# Patient Record
Sex: Female | Born: 1960 | Race: White | Hispanic: No | Marital: Married | Smoking: Former smoker
Health system: Southern US, Community
[De-identification: ages and names within clinical notes are randomized; demographics above are authoritative.]

## PROBLEM LIST (undated history)

## (undated) DIAGNOSIS — G4730 Sleep apnea, unspecified: Secondary | ICD-10-CM

## (undated) DIAGNOSIS — F32A Depression, unspecified: Secondary | ICD-10-CM

## (undated) DIAGNOSIS — F329 Major depressive disorder, single episode, unspecified: Secondary | ICD-10-CM

## (undated) DIAGNOSIS — M1990 Unspecified osteoarthritis, unspecified site: Secondary | ICD-10-CM

## (undated) DIAGNOSIS — Z780 Asymptomatic menopausal state: Secondary | ICD-10-CM

## (undated) DIAGNOSIS — K219 Gastro-esophageal reflux disease without esophagitis: Secondary | ICD-10-CM

## (undated) DIAGNOSIS — F419 Anxiety disorder, unspecified: Secondary | ICD-10-CM

## (undated) DIAGNOSIS — R310 Gross hematuria: Secondary | ICD-10-CM

## (undated) DIAGNOSIS — J189 Pneumonia, unspecified organism: Secondary | ICD-10-CM

## (undated) DIAGNOSIS — M797 Fibromyalgia: Secondary | ICD-10-CM

## (undated) DIAGNOSIS — E785 Hyperlipidemia, unspecified: Secondary | ICD-10-CM

## (undated) DIAGNOSIS — R7303 Prediabetes: Secondary | ICD-10-CM

## (undated) DIAGNOSIS — Z87442 Personal history of urinary calculi: Secondary | ICD-10-CM

## (undated) DIAGNOSIS — O021 Missed abortion: Secondary | ICD-10-CM

## (undated) DIAGNOSIS — R300 Dysuria: Secondary | ICD-10-CM

## (undated) DIAGNOSIS — K2970 Gastritis, unspecified, without bleeding: Secondary | ICD-10-CM

## (undated) DIAGNOSIS — J302 Other seasonal allergic rhinitis: Secondary | ICD-10-CM

## (undated) DIAGNOSIS — K275 Chronic or unspecified peptic ulcer, site unspecified, with perforation: Secondary | ICD-10-CM

## (undated) DIAGNOSIS — I499 Cardiac arrhythmia, unspecified: Secondary | ICD-10-CM

## (undated) DIAGNOSIS — K2210 Ulcer of esophagus without bleeding: Secondary | ICD-10-CM

## (undated) HISTORY — PX: FOOT SURGERY: SHX648

## (undated) HISTORY — DX: Other seasonal allergic rhinitis: J30.2

## (undated) HISTORY — DX: Major depressive disorder, single episode, unspecified: F32.9

## (undated) HISTORY — DX: Gastritis, unspecified, without bleeding: K29.70

## (undated) HISTORY — DX: Depression, unspecified: F32.A

## (undated) HISTORY — PX: COLONOSCOPY: SHX174

## (undated) HISTORY — DX: Missed abortion: O02.1

## (undated) HISTORY — DX: Sleep apnea, unspecified: G47.30

## (undated) HISTORY — DX: Hyperlipidemia, unspecified: E78.5

## (undated) HISTORY — DX: Anxiety disorder, unspecified: F41.9

## (undated) HISTORY — PX: KNEE ARTHROSCOPY: SUR90

## (undated) HISTORY — DX: Fibromyalgia: M79.7

## (undated) HISTORY — DX: Chronic or unspecified peptic ulcer, site unspecified, with perforation: K27.5

## (undated) HISTORY — DX: Unspecified osteoarthritis, unspecified site: M19.90

## (undated) HISTORY — PX: DILATION AND CURETTAGE OF UTERUS: SHX78

## (undated) HISTORY — DX: Asymptomatic menopausal state: Z78.0

## (undated) HISTORY — DX: Ulcer of esophagus without bleeding: K22.10

---

## 1995-03-17 HISTORY — PX: VAGINAL HYSTERECTOMY: SUR661

## 1997-07-17 ENCOUNTER — Other Ambulatory Visit: Admission: RE | Admit: 1997-07-17 | Discharge: 1997-07-17 | Payer: Self-pay | Admitting: Gynecology

## 1997-10-26 ENCOUNTER — Other Ambulatory Visit: Admission: RE | Admit: 1997-10-26 | Discharge: 1997-10-26 | Payer: Self-pay | Admitting: Gynecology

## 1997-12-25 ENCOUNTER — Inpatient Hospital Stay (HOSPITAL_COMMUNITY): Admission: AD | Admit: 1997-12-25 | Discharge: 1997-12-26 | Payer: Self-pay | Admitting: Gynecology

## 1997-12-29 ENCOUNTER — Inpatient Hospital Stay (HOSPITAL_COMMUNITY): Admission: AD | Admit: 1997-12-29 | Discharge: 1997-12-29 | Payer: Self-pay | Admitting: Obstetrics and Gynecology

## 1998-01-07 ENCOUNTER — Ambulatory Visit (HOSPITAL_COMMUNITY): Admission: RE | Admit: 1998-01-07 | Discharge: 1998-01-07 | Payer: Self-pay | Admitting: Gynecology

## 1999-02-11 ENCOUNTER — Other Ambulatory Visit: Admission: RE | Admit: 1999-02-11 | Discharge: 1999-02-11 | Payer: Self-pay | Admitting: Gynecology

## 2000-02-11 ENCOUNTER — Other Ambulatory Visit: Admission: RE | Admit: 2000-02-11 | Discharge: 2000-02-11 | Payer: Self-pay | Admitting: Gynecology

## 2000-03-20 ENCOUNTER — Emergency Department (HOSPITAL_COMMUNITY): Admission: EM | Admit: 2000-03-20 | Discharge: 2000-03-20 | Payer: Self-pay | Admitting: Emergency Medicine

## 2001-02-14 ENCOUNTER — Other Ambulatory Visit: Admission: RE | Admit: 2001-02-14 | Discharge: 2001-02-14 | Payer: Self-pay | Admitting: Gynecology

## 2002-02-15 ENCOUNTER — Other Ambulatory Visit: Admission: RE | Admit: 2002-02-15 | Discharge: 2002-02-15 | Payer: Self-pay | Admitting: Gynecology

## 2003-02-27 ENCOUNTER — Other Ambulatory Visit: Admission: RE | Admit: 2003-02-27 | Discharge: 2003-02-27 | Payer: Self-pay | Admitting: Gynecology

## 2003-06-06 ENCOUNTER — Ambulatory Visit (HOSPITAL_BASED_OUTPATIENT_CLINIC_OR_DEPARTMENT_OTHER): Admission: RE | Admit: 2003-06-06 | Discharge: 2003-06-06 | Payer: Self-pay | Admitting: Otolaryngology

## 2003-06-26 ENCOUNTER — Encounter: Admission: RE | Admit: 2003-06-26 | Discharge: 2003-06-26 | Payer: Self-pay | Admitting: Family Medicine

## 2004-02-28 ENCOUNTER — Other Ambulatory Visit: Admission: RE | Admit: 2004-02-28 | Discharge: 2004-02-28 | Payer: Self-pay | Admitting: Gynecology

## 2005-03-05 ENCOUNTER — Other Ambulatory Visit: Admission: RE | Admit: 2005-03-05 | Discharge: 2005-03-05 | Payer: Self-pay | Admitting: Gynecology

## 2005-03-05 ENCOUNTER — Ambulatory Visit (HOSPITAL_BASED_OUTPATIENT_CLINIC_OR_DEPARTMENT_OTHER): Admission: RE | Admit: 2005-03-05 | Discharge: 2005-03-05 | Payer: Self-pay | Admitting: Otolaryngology

## 2005-03-09 ENCOUNTER — Ambulatory Visit: Payer: Self-pay | Admitting: Internal Medicine

## 2006-03-10 ENCOUNTER — Other Ambulatory Visit: Admission: RE | Admit: 2006-03-10 | Discharge: 2006-03-10 | Payer: Self-pay | Admitting: Gynecology

## 2006-03-29 ENCOUNTER — Encounter: Admission: RE | Admit: 2006-03-29 | Discharge: 2006-06-27 | Payer: Self-pay | Admitting: Gynecology

## 2007-03-15 ENCOUNTER — Other Ambulatory Visit: Admission: RE | Admit: 2007-03-15 | Discharge: 2007-03-15 | Payer: Self-pay | Admitting: Gynecology

## 2007-08-15 ENCOUNTER — Emergency Department (HOSPITAL_COMMUNITY): Admission: EM | Admit: 2007-08-15 | Discharge: 2007-08-15 | Payer: Self-pay | Admitting: Emergency Medicine

## 2007-12-26 ENCOUNTER — Encounter: Admission: RE | Admit: 2007-12-26 | Discharge: 2007-12-26 | Payer: Self-pay | Admitting: Internal Medicine

## 2008-02-16 ENCOUNTER — Encounter: Admission: RE | Admit: 2008-02-16 | Discharge: 2008-02-16 | Payer: Self-pay | Admitting: Cardiology

## 2008-03-15 ENCOUNTER — Other Ambulatory Visit: Admission: RE | Admit: 2008-03-15 | Discharge: 2008-03-15 | Payer: Self-pay | Admitting: Gynecology

## 2008-03-15 ENCOUNTER — Encounter: Payer: Self-pay | Admitting: Women's Health

## 2008-03-15 ENCOUNTER — Ambulatory Visit: Payer: Self-pay | Admitting: Women's Health

## 2009-01-14 ENCOUNTER — Emergency Department (HOSPITAL_COMMUNITY): Admission: EM | Admit: 2009-01-14 | Discharge: 2009-01-14 | Payer: Self-pay | Admitting: Emergency Medicine

## 2009-01-16 ENCOUNTER — Ambulatory Visit: Payer: Self-pay | Admitting: Gynecology

## 2009-03-16 HISTORY — PX: FOOT SURGERY: SHX648

## 2009-03-27 ENCOUNTER — Ambulatory Visit: Payer: Self-pay | Admitting: Gynecology

## 2009-03-27 ENCOUNTER — Other Ambulatory Visit: Admission: RE | Admit: 2009-03-27 | Discharge: 2009-03-27 | Payer: Self-pay | Admitting: Gynecology

## 2009-06-12 ENCOUNTER — Ambulatory Visit: Payer: Self-pay | Admitting: Gynecology

## 2010-04-08 ENCOUNTER — Encounter
Admission: RE | Admit: 2010-04-08 | Discharge: 2010-04-08 | Payer: Self-pay | Source: Home / Self Care | Attending: Internal Medicine | Admitting: Internal Medicine

## 2010-04-11 ENCOUNTER — Ambulatory Visit: Admit: 2010-04-11 | Payer: Self-pay | Admitting: Gynecology

## 2010-04-15 ENCOUNTER — Other Ambulatory Visit (HOSPITAL_COMMUNITY)
Admission: RE | Admit: 2010-04-15 | Discharge: 2010-04-15 | Disposition: A | Discharge status: Home / Self Care | Payer: BC Managed Care – PPO | Source: Ambulatory Visit | Attending: Gynecology | Admitting: Gynecology

## 2010-04-15 ENCOUNTER — Ambulatory Visit
Admission: RE | Admit: 2010-04-15 | Discharge: 2010-04-15 | Payer: Self-pay | Source: Home / Self Care | Attending: Gynecology | Admitting: Gynecology

## 2010-04-15 ENCOUNTER — Other Ambulatory Visit: Payer: Self-pay | Admitting: Gynecology

## 2010-04-15 DIAGNOSIS — Z124 Encounter for screening for malignant neoplasm of cervix: Secondary | ICD-10-CM | POA: Insufficient documentation

## 2010-04-16 ENCOUNTER — Encounter: Payer: Self-pay | Admitting: Gynecology

## 2010-06-18 LAB — URINE MICROSCOPIC-ADD ON

## 2010-06-18 LAB — COMPREHENSIVE METABOLIC PANEL
ALT: 27 U/L (ref 0–35)
AST: 23 U/L (ref 0–37)
Alkaline Phosphatase: 60 U/L (ref 39–117)
Calcium: 9.3 mg/dL (ref 8.4–10.5)
GFR calc Af Amer: >60 mL/min (ref >60)
GFR calc non Af Amer: >60 mL/min (ref >60)
Sodium: 135 mEq/L (ref 135–145)
Total Bilirubin: 0.6 mg/dL (ref 0.3–1.2)

## 2010-06-18 LAB — URINALYSIS, ROUTINE W REFLEX MICROSCOPIC
Glucose, UA: NEGATIVE mg/dL
Ketones, ur: 15 mg/dL — AB
Leukocytes, UA: NEGATIVE
Nitrite: NEGATIVE
Specific Gravity, Urine: 1.015 (ref 1.005–1.030)
Urobilinogen, UA: 0.2 mg/dL (ref 0.0–1.0)
pH: 7 (ref 5.0–8.0)

## 2010-06-18 LAB — CBC
MCHC: 35 g/dL (ref 30.0–36.0)
MCV: 92.8 fL (ref 78.0–100.0)
RDW: 12.8 % (ref 11.5–15.5)
WBC: 12.7 10*3/uL — ABNORMAL HIGH (ref 4.0–10.5)

## 2010-06-18 LAB — DIFFERENTIAL
Eosinophils Absolute: 0.1 10*3/uL (ref 0.0–0.7)
Lymphocytes Relative: 11 % — ABNORMAL LOW (ref 12–46)
Monocytes Absolute: 0.7 10*3/uL (ref 0.1–1.0)
Monocytes Relative: 6 % (ref 3–12)
Neutro Abs: 10.4 10*3/uL — ABNORMAL HIGH (ref 1.7–7.7)
Neutrophils Relative %: 82 % — ABNORMAL HIGH (ref 43–77)

## 2010-06-18 LAB — WET PREP, GENITAL
Clue Cells Wet Prep HPF POC: NONE SEEN
WBC, Wet Prep HPF POC: NONE SEEN

## 2010-07-16 ENCOUNTER — Other Ambulatory Visit: Payer: Self-pay | Admitting: Podiatrist

## 2010-07-16 DIAGNOSIS — M79672 Pain in left foot: Secondary | ICD-10-CM

## 2010-07-16 DIAGNOSIS — M79671 Pain in right foot: Secondary | ICD-10-CM

## 2010-07-18 ENCOUNTER — Other Ambulatory Visit: Payer: Commercial Indemnity

## 2010-08-01 NOTE — Procedures (Signed)
NAMECHRISTIE, Breanna Palmer              ACCOUNT NO.:  [id number]   MEDICAL RECORD NO.:  [id number]          PATIENT TYPE:  OUT   LOCATION:  SLEEP CENTER                 FACILITY:  [facility]   PHYSICIAN:  Clinton D. Maple Hudson, M.D. DATE OF BIRTH:  Jan 21, 1961   DATE OF STUDY:  03/05/2005                              NOCTURNAL POLYSOMNOGRAM   Audio too short to transcribe (less than 5 seconds)      Clinton D. Maple Hudson, M.D.  Diplomate, American Board of Sleep Medicine     CDY/MEDQ  D:  03/09/2005 16:56:59  T:  03/09/2005 16:58:02  Job:  [id number]

## 2010-08-01 NOTE — Procedures (Signed)
Breanna Palmer, ANDREAS              ACCOUNT NO.:  [id number]   MEDICAL RECORD NO.:  [id number]          PATIENT TYPE:  OUT   LOCATION:  SLEEP CENTER                 FACILITY:  [facility]   PHYSICIAN:  Clinton D. Maple Hudson, M.D. DATE OF BIRTH:  [birth year]   DATE OF STUDY:  03/05/2005                              NOCTURNAL POLYSOMNOGRAM   REFERRING PHYSICIAN:  Lucky Cowboy,   DATE OF STUDY:  March 05, 2005.   INDICATION FOR STUDY:  Hypersomnia with sleep apnea.   EPWORTH SLEEPINESS SCORE:  9/24.   BMI:  26.8.   WEIGHT:  167 pounds.   MEDICATIONS:  Home medications include Requip, Klonopin.   SLEEP ARCHITECTURE:  Total sleep time 392 minutes with sleep efficiency 84%.  Stage I was 1%, stage II 93%, stages III and IV were absent, REM 6% of total  sleep time. Sleep latency 67 minutes, REM latency 297 minutes, awake after  sleep onset 6 minutes, arousal index 2.4. Bedtime medication not recorded.   RESPIRATORY DATA:  Apnea hypopnea index (AHI, RDI) 0.8 obstructive events  per hour which is within normal limits. This included 2 obstructive apneas  and 3 hypopneas. All events were recorded while sleeping supine suggesting a  positional component. REM RDI 5.5.   OXYGEN DATA:  Mild to moderate intermittent snoring with oxygen desaturation  to a nadir of 90%. Mean oxygen saturation through the study was 97% on room  air.   CARDIAC DATA:  Sinus rhythm with what appears to be an elevated ST T-wave  segment on single lead recording but no ectopic seen.   MOVEMENT/PARASOMNIAS:  Rare leg jerk with little effect on sleep.   IMPRESSION/RECOMMENDATIONS:  1.  Sleep architecture mainly significant for reduced REM which may be a      medication effect.  2.  Occasional sleep disorder breathing event, AHI 0.8 per hour, within      normal limits. Events were positional and not seen when she was sleeping      on her sides or prone.      Clinton D. Maple Hudson, M.D.  Diplomate, Biomedical engineer of Sleep  Medicine  Electronically Signed     CDY/MEDQ  D:  03/09/2005 17:02:58  T:  03/09/2005 21:46:36  Job:  [id number]

## 2010-08-04 ENCOUNTER — Ambulatory Visit
Admission: RE | Admit: 2010-08-04 | Discharge: 2010-08-04 | Disposition: A | Discharge status: Home / Self Care | Payer: BC Managed Care – PPO | Source: Ambulatory Visit | Attending: Podiatrist | Admitting: Podiatrist

## 2010-08-04 DIAGNOSIS — M79672 Pain in left foot: Secondary | ICD-10-CM

## 2010-08-04 DIAGNOSIS — M79671 Pain in right foot: Secondary | ICD-10-CM

## 2010-08-04 MED ORDER — GADOBENATE DIMEGLUMINE 529 MG/ML IV SOLN
7.0000 mL | Freq: Once | INTRAVENOUS | Status: AC | PRN
  Administered 2010-08-04: 7 mL via INTRAVENOUS

## 2011-02-19 ENCOUNTER — Ambulatory Visit (AMBULATORY_SURGERY_CENTER): Payer: BC Managed Care – PPO | Admitting: *Deleted

## 2011-02-19 VITALS — Ht 67.0 in | Wt 167.7 lb

## 2011-02-19 DIAGNOSIS — Z1211 Encounter for screening for malignant neoplasm of colon: Secondary | ICD-10-CM

## 2011-02-19 MED ORDER — PEG-KCL-NACL-NASULF-NA ASC-C 100 G PO SOLR: ORAL | Status: DC

## 2011-03-04 ENCOUNTER — Encounter: Payer: Self-pay | Admitting: Gastroenterology

## 2011-03-04 ENCOUNTER — Ambulatory Visit (AMBULATORY_SURGERY_CENTER): Payer: BC Managed Care – PPO | Admitting: Gastroenterology

## 2011-03-04 VITALS — BP 123/95 | HR 99 | Temp 98.0°F | Resp 18 | Ht 67.0 in | Wt 167.0 lb

## 2011-03-04 DIAGNOSIS — Z1211 Encounter for screening for malignant neoplasm of colon: Secondary | ICD-10-CM

## 2011-03-04 LAB — GLUCOSE, CAPILLARY: Glucose-Capillary: 103 mg/dL — ABNORMAL HIGH (ref 70–99)

## 2011-03-04 MED ORDER — SODIUM CHLORIDE 0.9 % IV SOLN: 500.0000 mL | INTRAVENOUS | Status: DC

## 2011-03-04 NOTE — Progress Notes (Signed)
No complaints noted int e recovery room. Maw  Patient did not experience any of the following events: a burn prior to discharge; a fall within the facility; wrong site/side/patient/procedure/implant event; or a hospital transfer or hospital admission upon discharge from the facility. (G8907)Patient did not have preoperative order for IV antibiotic SSI prophylaxis. (G8918) 

## 2011-03-04 NOTE — Op Note (Signed)
[facility] [address] [place], [place]  [address]  COLONOSCOPY PROCEDURE REPORT  PATIENT:  Breanna, Palmer  MR#:  [id number] BIRTHDATE:  1960-11-01, 50 yrs. old  GENDER:  female ENDOSCOPIST:  Vania Rea. Jarold Motto, MD, [facility] REF. BY:  Italy Badger, M.D. PROCEDURE DATE:  03/04/2011 PROCEDURE:  Average-risk screening colonoscopy G0121 ASA CLASS:  Class II INDICATIONS:  Routine Risk Screening MEDICATIONS:   propofol (Diprivan) 200 mg IV  DESCRIPTION OF PROCEDURE:   After the risks and benefits and of the procedure were explained, informed consent was obtained. Digital rectal exam was performed and revealed no abnormalities. The LB [id number] endoscope was introduced through the anus and advanced to the cecum, which was identified by both the appendix and ileocecal valve.  The quality of the prep was excellent, using MoviPrep.  The instrument was then slowly withdrawn as the colon was fully examined. <<PROCEDUREIMAGES>>  FINDINGS:  No polyps or cancers were seen.  This was otherwise a normal examination of the colon.   Retroflexed views in the rectum revealed no abnormalities.    The scope was then withdrawn from the patient and the procedure completed.  COMPLICATIONS:  None ENDOSCOPIC IMPRESSION: 1) No polyps or cancers 2) Otherwise normal examination RECOMMENDATIONS: 1) Continue current colorectal screening recommendations for "routine risk" patients with a repeat colonoscopy in 10 years.  REPEAT EXAM:  No  ______________________________ Vania Rea. Jarold Motto, MD, Clementeen Graham  CC:  n. eSIGNED:   Vania Rea. Patterson at 03/04/2011 08:54 AM  Jacolyn Reedy, [phone]

## 2011-03-04 NOTE — Patient Instructions (Signed)
See the picture page for your findings from your exam today.  Follow the green and blue discharge instruction sheets the rest of the day.  Resume your prior medications today. Please call if any questions or concerns.  

## 2011-03-05 ENCOUNTER — Telehealth: Payer: Self-pay

## 2011-03-05 NOTE — Telephone Encounter (Signed)

## 2011-04-20 ENCOUNTER — Encounter: Payer: Self-pay | Admitting: Gynecology

## 2011-04-20 ENCOUNTER — Ambulatory Visit (INDEPENDENT_AMBULATORY_CARE_PROVIDER_SITE_OTHER): Payer: BC Managed Care – PPO | Admitting: Gynecology

## 2011-04-20 DIAGNOSIS — Z01419 Encounter for gynecological examination (general) (routine) without abnormal findings: Secondary | ICD-10-CM

## 2011-04-20 DIAGNOSIS — Z1211 Encounter for screening for malignant neoplasm of colon: Secondary | ICD-10-CM

## 2011-04-20 DIAGNOSIS — N951 Menopausal and female climacteric states: Secondary | ICD-10-CM

## 2011-04-20 DIAGNOSIS — M797 Fibromyalgia: Secondary | ICD-10-CM

## 2011-04-20 DIAGNOSIS — F419 Anxiety disorder, unspecified: Secondary | ICD-10-CM

## 2011-04-20 DIAGNOSIS — E7800 Pure hypercholesterolemia, unspecified: Secondary | ICD-10-CM

## 2011-04-20 DIAGNOSIS — F411 Generalized anxiety disorder: Secondary | ICD-10-CM

## 2011-04-20 DIAGNOSIS — IMO0001 Reserved for inherently not codable concepts without codable children: Secondary | ICD-10-CM

## 2011-04-20 MED ORDER — CLONAZEPAM 0.5 MG PO TABS: 0.5000 mg | ORAL_TABLET | Freq: Two times a day (BID) | ORAL | Status: DC | PRN

## 2011-04-20 NOTE — Progress Notes (Signed)
DEETYA Palmer 30-Jul-1960 [id number]   History:    51 y.o.  for annual exam with only main complaint has been of weight gain. Review of her record indicated she was weighing 158 is now weight 163. She was being followed by Tomi Bamberger, NP who had drawn on her lab work recently. 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 Cyndia Bent. Patient has a history hypercholesterolemia hypercholesterolemia for which she is on simvastatin. Also for her anxiety she takes Klonopin 0.5 mg daily on a when necessary basis. She also suffers from fibromyalgia for which she is on Cymbalta 60 mg daily. Patient would know prior bone density study. She was recently put on prednisone 5 mg daily for some type of foot inflammation? Last colonoscopy December 2012 negative. Last mammogram September 2013 normal.  Past medical history,surgical history, family history and social history were all reviewed and documented in the EPIC chart.  Gynecologic History No LMP recorded. Patient has had a hysterectomy. Contraception: post menopausal status Last Pap: 2012. Results were: normal Last mammogram: 2013. Results were: normal  Obstetric History OB History    Grav Para Term Preterm Abortions TAB SAB Ect Mult Living   3 2 2  1  1   2      # Outc Date GA Lbr Len/2nd Wgt Sex Del Anes PTL Lv   1 TRM     M SVD  No Yes   2 TRM     F SVD  No Yes   3 SAB                ROS:  Was performed and pertinent positives and negatives are included in the history.  Exam: chaperone present  BP 130/80  Ht 5' 5.5" (1.664 m)  Wt 163 lb (73.936 kg)  BMI 26.71 kg/m2  Body mass index is 26.71 kg/(m^2).  General appearance : Well developed well nourished female. No acute distress HEENT: Neck supple, trachea midline, no carotid bruits, no thyroidmegaly Lungs: Clear to auscultation, no rhonchi or wheezes, or rib retractions  Heart: Regular rate and rhythm, no murmurs or gallops Breast:Examined in sitting and  supine position were symmetrical in appearance, no palpable masses or tenderness,  no skin retraction, no nipple inversion, no nipple discharge, no skin discoloration, no axillary or supraclavicular lymphadenopathy Abdomen: no palpable masses or tenderness, no rebound or guarding Extremities: no edema or skin discoloration or tenderness  Pelvic:  Bartholin, Urethra, Skene Glands: Within normal limits             Vagina: No gross lesions or discharge  Cervix: Absent  Uterus absent  Adnexa  Without masses or tenderness  Anus and perineum  normal   Rectovaginal  normal sphincter tone without palpated masses or tenderness             Hemoccult not done colonoscopy 2 months ago normal     Assessment/Plan:  51 y.o. female for annual exam we discussed the new screening guidelines for Pap smear. Patient with prior hysterectomy and no abnormal Pap smears over the past 20 years. Her lab work was drawn recently by her provider so no additional blood work will be drawn today. She will schedule her bone density study. She was encouraged to do her monthly self breast examination. She was encouraged to continue her calcium and vitamin D for osteoporosis prevention as well as weightbearing exercises 45 minutes 3-4 times a week. Literature information on exercise and weight reduction diet  was provided today. Prescription refill for Klonopin 0.5 mg when necessary daily. Will followup in one year or when necessary.     Ok Edwards MD, 4:55 PM 04/20/2011

## 2011-04-20 NOTE — Patient Instructions (Signed)
Cholesterol Control Diet  Cholesterol levels in your body are determined significantly by your diet. Cholesterol levels may also be related to heart disease. The following material helps to explain this relationship and discusses what you can do to help keep your heart healthy. Not all cholesterol is bad. Low-density lipoprotein (LDL) cholesterol is the "bad" cholesterol. It may cause fatty deposits to build up inside your arteries. High-density lipoprotein (HDL) cholesterol is "good." It helps to remove the "bad" LDL cholesterol from your blood. Cholesterol is a very important risk factor for heart disease. Other risk factors are high blood pressure, smoking, stress, heredity, and weight. The heart muscle gets its supply of blood through the coronary arteries. If your LDL cholesterol is high and your HDL cholesterol is low, you are at risk for having fatty deposits build up in your coronary arteries. This leaves less room through which blood can flow. Without sufficient blood and oxygen, the heart muscle cannot function properly and you may feel chest pains (angina pectoris). When a coronary artery closes up entirely, a part of the heart muscle may die, causing a heart attack (myocardial infarction). CHECKING CHOLESTEROL When your caregiver sends your blood to a lab to be analyzed for cholesterol, a complete lipid (fat) profile may be done. With this test, the total amount of cholesterol and levels of LDL and HDL are determined. Triglycerides are a type of fat that circulates in the blood and can also be used to determine heart disease risk. The list below describes what the numbers should be: Test: Total Cholesterol.  Less than 200 mg/dl.  Test: LDL "bad cholesterol."  Less than 100 mg/dl.   Less than 70 mg/dl if you are at very high risk of a heart attack or sudden cardiac death.  Test: HDL "good cholesterol."  Greater than 50 mg/dl for women.    Greater than 40 mg/dl for men.  Test: Triglycerides.  Less than 150 mg/dl.  CONTROLLING CHOLESTEROL WITH DIET Although exercise and lifestyle factors are important, your diet is key. That is because certain foods are known to raise cholesterol and others to lower it. The goal is to balance foods for their effect on cholesterol and more importantly, to replace saturated and trans fat with other types of fat, such as monounsaturated fat, polyunsaturated fat, and omega-3 fatty acids. On average, a person should consume no more than 15 to 17 g of saturated fat daily. Saturated and trans fats are considered "bad" fats, and they will raise LDL cholesterol. Saturated fats are primarily found in animal products such as meats, butter, and cream. However, that does not mean you need to sacrifice all your favorite foods. Today, there are good tasting, low-fat, low-cholesterol substitutes for most of the things you like to eat. Choose low-fat or nonfat alternatives. Choose round or loin cuts of red meat, since these types of cuts are lowest in fat and cholesterol. Chicken (without the skin), fish, veal, and ground turkey breast are excellent choices. Eliminate fatty meats, such as hot dogs and salami. Even shellfish have little or no saturated fat. Have a 3 oz (85 g) portion when you eat lean meat, poultry, or fish. Trans fats are also called "partially hydrogenated oils." They are oils that have been scientifically manipulated so that they are solid at room temperature resulting in a longer shelf life and improved taste and texture of foods in which they are added. Trans fats are found in stick margarine, some tub margarines, cookies, crackers, and baked goods.    When baking and cooking, oils are an excellent substitute for butter. The monounsaturated oils are especially beneficial since it is believed they lower LDL and raise HDL. The oils you should avoid entirely are saturated tropical oils, such as coconut and  palm.  Remember to eat liberally from food groups that are naturally free of saturated and trans fat, including fish, fruit, vegetables, beans, grains (barley, rice, couscous, bulgur wheat), and pasta (without cream sauces).  IDENTIFYING FOODS THAT LOWER CHOLESTEROL  Soluble fiber may lower your cholesterol. This type of fiber is found in fruits such as apples, vegetables such as broccoli, potatoes, and carrots, legumes such as beans, peas, and lentils, and grains such as barley. Foods fortified with plant sterols (phytosterol) may also lower cholesterol. You should eat at least 2 g per day of these foods for a cholesterol lowering effect.  Read package labels to identify low-saturated fats, trans fats free, and low-fat foods at the supermarket. Select cheeses that have only 2 to 3 g saturated fat per ounce. Use a heart-healthy tub margarine that is free of trans fats or partially hydrogenated oil. When buying baked goods (cookies, crackers), avoid partially hydrogenated oils. Breads and muffins should be made from whole grains (whole-wheat or whole oat flour, instead of "flour" or "enriched flour"). Buy non-creamy canned soups with reduced salt and no added fats.  FOOD PREPARATION TECHNIQUES  Never deep-fry. If you must fry, either stir-fry, which uses very little fat, or use non-stick cooking sprays. When possible, broil, bake, or roast meats, and steam vegetables. Instead of dressing vegetables with butter or margarine, use lemon and herbs, applesauce and cinnamon (for squash and sweet potatoes), nonfat yogurt, salsa, and low-fat dressings for salads.  LOW-SATURATED FAT / LOW-FAT FOOD SUBSTITUTES Meats / Saturated Fat (g)  Avoid: Steak, marbled (3 oz/85 g) / 11 g   Choose: Steak, lean (3 oz/85 g) / 4 g   Avoid: Hamburger (3 oz/85 g) / 7 g   Choose: Hamburger, lean (3 oz/85 g) / 5 g   Avoid: Ham (3 oz/85 g) / 6 g   Choose: Ham, lean cut (3 oz/85 g) / 2.4 g   Avoid: Chicken, with skin, dark  meat (3 oz/85 g) / 4 g   Choose: Chicken, skin removed, dark meat (3 oz/85 g) / 2 g   Avoid: Chicken, with skin, light meat (3 oz/85 g) / 2.5 g   Choose: Chicken, skin removed, light meat (3 oz/85 g) / 1 g  Dairy / Saturated Fat (g)  Avoid: Whole milk (1 cup) / 5 g   Choose: Low-fat milk, 2% (1 cup) / 3 g   Choose: Low-fat milk, 1% (1 cup) / 1.5 g   Choose: Skim milk (1 cup) / 0.3 g   Avoid: Hard cheese (1 oz/28 g) / 6 g   Choose: Skim milk cheese (1 oz/28 g) / 2 to 3 g   Avoid: Cottage cheese, 4% fat (1 cup) / 6.5 g   Choose: Low-fat cottage cheese, 1% fat (1 cup) / 1.5 g   Avoid: Ice cream (1 cup) / 9 g   Choose: Sherbet (1 cup) / 2.5 g   Choose: Nonfat frozen yogurt (1 cup) / 0.3 g   Choose: Frozen fruit bar / trace   Avoid: Whipped cream (1 tbs) / 3.5 g   Choose: Nondairy whipped topping (1 tbs) / 1 g  Condiments / Saturated Fat (g)  Avoid: Mayonnaise (1 tbs) / 2 g   Choose: Low-fat   mayonnaise (1 tbs) / 1 g   Avoid: Butter (1 tbs) / 7 g   Choose: Extra light margarine (1 tbs) / 1 g   Avoid: Coconut oil (1 tbs) / 11.8 g   Choose: Olive oil (1 tbs) / 1.8 g   Choose: Corn oil (1 tbs) / 1.7 g   Choose: Safflower oil (1 tbs) / 1.2 g   Choose: Sunflower oil (1 tbs) / 1.4 g   Choose: Soybean oil (1 tbs) / 2.4 g   Choose: Canola oil (1 tbs) / 1 g  Document Released: 03/02/2005 Document Revised: 11/12/2010 Document Reviewed: 08/21/2010 ExitCare Patient Information 2012 [employer].  Exercise to Lose Weight Exercise and a healthy diet may help you lose weight. Your doctor may suggest specific exercises. EXERCISE IDEAS AND TIPS  Choose low-cost things you enjoy doing, such as walking, bicycling, or exercising to workout videos.   Take stairs instead of the elevator.   Walk during your lunch break.   Park your car further away from work or school.   Go to a gym or an exercise class.   Start with 5 to 10 minutes of exercise each day. Build up to  30 minutes of exercise 4 to 6 days a week.   Wear shoes with good support and comfortable clothes.   Stretch before and after working out.   Work out until you breathe harder and your heart beats faster.   Drink extra water when you exercise.   Do not do so much that you hurt yourself, feel dizzy, or get very short of breath.  Exercises that burn about 150 calories:  Running 1  miles in 15 minutes.   Playing volleyball for 45 to 60 minutes.   Washing and waxing a car for 45 to 60 minutes.   Playing touch football for 45 minutes.   Walking 1  miles in 35 minutes.   Pushing a stroller 1  miles in 30 minutes.   Playing basketball for 30 minutes.   Raking leaves for 30 minutes.   Bicycling 5 miles in 30 minutes.   Walking 2 miles in 30 minutes.   Dancing for 30 minutes.   Shoveling snow for 15 minutes.   Swimming laps for 20 minutes.   Walking up stairs for 15 minutes.   Bicycling 4 miles in 15 minutes.   Gardening for 30 to 45 minutes.   Jumping rope for 15 minutes.   Washing windows or floors for 45 to 60 minutes.  Document Released: 04/04/2010 Document Revised: 11/12/2010 Document Reviewed: 04/04/2010 Exit[employer] Patient Information 2012 [employer].  

## 2012-04-22 ENCOUNTER — Encounter: Payer: Self-pay | Admitting: Gynecology

## 2013-01-16 ENCOUNTER — Other Ambulatory Visit: Payer: Self-pay | Admitting: Dermatology

## 2013-05-04 ENCOUNTER — Encounter: Payer: Self-pay | Admitting: Gynecology

## 2013-05-15 ENCOUNTER — Ambulatory Visit (INDEPENDENT_AMBULATORY_CARE_PROVIDER_SITE_OTHER): Payer: BC Managed Care – PPO | Admitting: Gynecology

## 2013-05-15 ENCOUNTER — Encounter: Payer: Self-pay | Admitting: Gynecology

## 2013-05-15 ENCOUNTER — Telehealth: Payer: Self-pay | Admitting: *Deleted

## 2013-05-15 VITALS — Ht 65.5 in | Wt 167.2 lb

## 2013-05-15 DIAGNOSIS — Z780 Asymptomatic menopausal state: Secondary | ICD-10-CM | POA: Insufficient documentation

## 2013-05-15 DIAGNOSIS — Z01419 Encounter for gynecological examination (general) (routine) without abnormal findings: Secondary | ICD-10-CM

## 2013-05-15 DIAGNOSIS — R6882 Decreased libido: Secondary | ICD-10-CM

## 2013-05-15 DIAGNOSIS — N951 Menopausal and female climacteric states: Secondary | ICD-10-CM

## 2013-05-15 DIAGNOSIS — R635 Abnormal weight gain: Secondary | ICD-10-CM

## 2013-05-15 MED ORDER — SILDENAFIL CITRATE 25 MG PO TABS: ORAL_TABLET | ORAL | Status: DC

## 2013-05-15 NOTE — Telephone Encounter (Signed)
Pharmacy called stating the Viagra 25 mg is not covered by pt insurance company. The Viagra 20 mg is covered and cheaper okay to switch? Please advise

## 2013-05-15 NOTE — Telephone Encounter (Signed)
Yes

## 2013-05-15 NOTE — Patient Instructions (Signed)
Sildenafil tablets (Viagra)  What is this medicine?  SILDENAFIL (sil DEN a fil) is used to treat erection problems in men.  This medicine may be used for other purposes; ask your health care provider or pharmacist if you have questions.  COMMON BRAND NAME(S): Viagra  What should I tell my health care provider before I take this medicine?  They need to know if you have any of these conditions:  -bleeding disorders  -eye or vision problems, including a rare inherited eye disease called retinitis pigmentosa  -anatomical deformation of the penis, Peyronie's disease, or history of priapism (painful and prolonged erection)  -heart disease, angina, a history of heart attack, irregular heart beats, or other heart problems  -high or low blood pressure  -history of blood diseases, like sickle cell anemia or leukemia  -history of stomach bleeding  -kidney disease  -liver disease  -stroke  -an unusual or allergic reaction to sildenafil, other medicines, foods, dyes, or preservatives  -pregnant or trying to get pregnant  -breast-feeding  How should I use this medicine?  Take this medicine by mouth with a glass of water. Follow the directions on the prescription label. The dose is usually taken 1 hour before sexual activity. You should not take the dose more than once per day. Do not take your medicine more often than directed.  Talk to your pediatrician regarding the use of this medicine in children. This medicine is not used in children for this condition.  Overdosage: If you think you have taken too much of this medicine contact a poison control center or emergency room at once.  NOTE: This medicine is only for you. Do not share this medicine with others.  What if I miss a dose?  This does not apply. Do not take double or extra doses.  What may interact with this medicine?  Do not take this medicine with any of the following medications:  -cisapride  -methscopolamine nitrate  -nitrates like amyl nitrite, isosorbide dinitrate,  isosorbide mononitrate, nitroglycerin  -nitroprusside  -other medicines for erectile dysfunction like avanafil, tadalafil, vardenafil  -other sildenafil products (Revatio)   This medicine may also interact with the following medications:  -certain drugs for high blood pressure  -certain drugs for the treatment of HIV infection or AIDS  -certain drugs used for fungal or yeast infections, like fluconazole, itraconazole, ketoconazole, and voriconazole  -cimetidine  -erythromycin  -rifampin  This list may not describe all possible interactions. Give your health care provider a list of all the medicines, herbs, non-prescription drugs, or dietary supplements you use. Also tell them if you smoke, drink alcohol, or use illegal drugs. Some items may interact with your medicine.  What should I watch for while using this medicine?  If you notice any changes in your vision while taking this drug, call your doctor or health care professional as soon as possible. Stop using this medicine and call your health care provider right away if you have a loss of sight in one or both eyes.  Contact your doctor or health care professional right away if you have an erection that lasts longer than 4 hours or if it becomes painful. This may be a sign of a serious problem and must be treated right away to prevent permanent damage.  If you experience symptoms of nausea, dizziness, chest pain or arm pain upon initiation of sexual activity after taking this medicine, you should refrain from further activity and call your doctor or health care professional as soon   as possible.  Do not drink alcohol to excess (examples, 5 glasses of wine or 5 shots of whiskey) when taking this medicine. When taken in excess, alcohol can increase your chances of getting a headache or getting dizzy, increasing your heart rate or lowering your blood pressure.  Using this medicine does not protect you or your partner against HIV infection (the virus that causes AIDS)  or other sexually transmitted diseases.  What side effects may I notice from receiving this medicine?  Side effects that you should report to your doctor or health care professional as soon as possible:  -allergic reactions like skin rash, itching or hives, swelling of the face, lips, or tongue  -breathing problems  -changes in hearing  -changes in vision  -chest pain  -fast, irregular heartbeat  -prolonged or painful erection  -seizures   Side effects that usually do not require medical attention (report to your doctor or health care professional if they continue or are bothersome):  -back pain  -dizziness  -flushing  -headache  -indigestion  -muscle aches  -nausea  -stuffy or runny nose  This list may not describe all possible side effects. Call your doctor for medical advice about side effects. You may report side effects to FDA at 1-800-FDA-1088.  Where should I keep my medicine?  Keep out of reach of children.  Store at room temperature between 15 and 30 degrees C (59 and 86 degrees F). Throw away any unused medicine after the expiration date.  NOTE: This sheet is a summary. It may not cover all possible information. If you have questions about this medicine, talk to your doctor, pharmacist, or health care provider.   2014, Elsevier/Gold Standard. (2012-03-02 12:43:54)

## 2013-05-15 NOTE — Progress Notes (Signed)
Breanna Palmer [birth year] [id number]   History:    53 y.o.  for annual gyn exam with complaint of decreased libido.She was being followed by Delia Chimes, NP who had drawn on her lab work recently.Toy Cookey, NP who had drawn on her lab work recently. 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 Melford Aase. Patient has a history hypercholesterolemia hypercholesterolemia for which she is on simvastatin. Also for her anxiety she takes Klonopin 0.5 mg daily on a when necessary basis. She also suffers from fibromyalgia for which she is on Cymbalta 60 mg daily. Patient has not had a baseline bone density study as of yet. Last colonoscopy was normal in 2012. Patient with past history transvaginal hysterectomy without BSO. Patient had been on HRT for short time but did not notice much of a difference and is currently on no hormones.    Past medical history,surgical history, family history and social history were all reviewed and documented in the EPIC chart.  Gynecologic History No LMP recorded. Patient has had a hysterectomy. Contraception: status post hysterectomy Last Pap: 2012. Results were: normal Last mammogram: 2015. Results were: normal  Obstetric History OB History  Gravida Para Term Preterm AB SAB TAB Ectopic Multiple Living  3 2 2  1 1    2     # Outcome Date GA Lbr Len/2nd Weight Sex Delivery Anes PTL Lv  3 SAB           2 TRM     F SVD  N Y  1 TRM     M SVD  N Y       ROS: A ROS was performed and pertinent positives and negatives are included in the history.  GENERAL: No fevers or chills. HEENT: No change in vision, no earache, sore throat or sinus congestion. NECK: No pain or stiffness. CARDIOVASCULAR: No chest pain or pressure. No palpitations. PULMONARY: No shortness of breath, cough or wheeze. GASTROINTESTINAL: No abdominal pain, nausea, vomiting or diarrhea, melena or bright red blood per rectum. GENITOURINARY: No urinary frequency, urgency,  hesitancy or dysuria. MUSCULOSKELETAL: No joint or muscle pain, no back pain, no recent trauma. DERMATOLOGIC: No rash, no itching, no lesions. ENDOCRINE: No polyuria, polydipsia, no heat or cold intolerance. No recent change in weight. HEMATOLOGICAL: No anemia or easy bruising or bleeding. NEUROLOGIC: No headache, seizures, numbness, tingling or weakness. PSYCHIATRIC: No depression, no loss of interest in normal activity or change in sleep pattern.     Exam: chaperone present  Ht 5' 5.5" (1.664 m)  Wt 167 lb 3.2 oz (75.841 kg)  BMI 27.39 kg/m2  Body mass index is 27.39 kg/(m^2).  General appearance : Well developed well nourished female. No acute distress HEENT: Neck supple, trachea midline, no carotid bruits, no thyroidmegaly Lungs: Clear to auscultation, no rhonchi or wheezes, or rib retractions  Heart: Regular rate and rhythm, no murmurs or gallops Breast:Examined in sitting and supine position were symmetrical in appearance, no palpable masses or tenderness,  no skin retraction, no nipple inversion, no nipple discharge, no skin discoloration, no axillary or supraclavicular lymphadenopathy Abdomen: no palpable masses or tenderness, no rebound or guarding Extremities: no edema or skin discoloration or tenderness  Pelvic:  Bartholin, Urethra, Skene Glands: Within normal limits             Vagina: No gross lesions or discharge  Cervix: Absent  Uterus  Absent  Adnexa  Without masses or tenderness  Anus and perineum  normal   Rectovaginal  normal sphincter tone without palpated masses or tenderness             Hemoccult cards provided     Assessment/Plan:  53 y.o. female for annual exam with  hypoactive sexual desire ( disorder (HSDD) who will be given a trial of Sildenafil (Viagra) and this has been used in female throughout the country but has not been approved by the FDA for the treatment of HSDD so this would be an off label treatment and patient is fully aware. She'll be started on  a low-dose 20 mg daily when necessary. Potential warnings and side effects from the medication had been discussed with the patient and she will return back in one month for followup. We also discussed with her that Cymbalta  could affect her libido as well. Patient fully understands all the above and accepts the risk. Patient will return in one month as well for bone density study. We discussed importance of calcium vitamin D and regular exercise for osteoporosis prevention.  Note: This dictation was prepared with  Dragon/digital dictation along withSmart phrase technology. Any transcriptional errors that result from this process are unintentional.   Terrance Mass MD, 4:58 PM 05/15/2013

## 2013-05-15 NOTE — Telephone Encounter (Signed)
Pharmacy informed with the below note 

## 2013-07-05 ENCOUNTER — Other Ambulatory Visit: Payer: BC Managed Care – PPO | Admitting: Anesthesiology

## 2013-07-05 DIAGNOSIS — K921 Melena: Secondary | ICD-10-CM

## 2013-07-05 DIAGNOSIS — Z1211 Encounter for screening for malignant neoplasm of colon: Secondary | ICD-10-CM

## 2013-07-06 ENCOUNTER — Ambulatory Visit (INDEPENDENT_AMBULATORY_CARE_PROVIDER_SITE_OTHER): Payer: BC Managed Care – PPO

## 2013-07-06 ENCOUNTER — Ambulatory Visit: Payer: BC Managed Care – PPO | Admitting: Gynecology

## 2013-07-06 DIAGNOSIS — M899 Disorder of bone, unspecified: Secondary | ICD-10-CM

## 2013-07-06 DIAGNOSIS — Z780 Asymptomatic menopausal state: Secondary | ICD-10-CM

## 2013-07-06 DIAGNOSIS — M949 Disorder of cartilage, unspecified: Secondary | ICD-10-CM

## 2013-07-06 DIAGNOSIS — M8580 Other specified disorders of bone density and structure, unspecified site: Secondary | ICD-10-CM

## 2013-07-10 ENCOUNTER — Other Ambulatory Visit: Payer: Self-pay | Admitting: Gynecology

## 2013-07-10 ENCOUNTER — Encounter: Payer: Self-pay | Admitting: Gastroenterology

## 2013-07-10 DIAGNOSIS — M8580 Other specified disorders of bone density and structure, unspecified site: Secondary | ICD-10-CM

## 2013-08-16 ENCOUNTER — Telehealth: Payer: Self-pay | Admitting: *Deleted

## 2013-08-16 NOTE — Telephone Encounter (Signed)
Pt asked for copy of hemocult stool result, mailed to patient. Pt has appointment with Dr.Kalpan 09/05/13.

## 2013-09-05 ENCOUNTER — Other Ambulatory Visit (INDEPENDENT_AMBULATORY_CARE_PROVIDER_SITE_OTHER): Payer: BC Managed Care – PPO

## 2013-09-05 ENCOUNTER — Ambulatory Visit (INDEPENDENT_AMBULATORY_CARE_PROVIDER_SITE_OTHER): Payer: BC Managed Care – PPO | Admitting: Gastroenterology

## 2013-09-05 ENCOUNTER — Other Ambulatory Visit: Payer: BC Managed Care – PPO

## 2013-09-05 ENCOUNTER — Encounter: Payer: Self-pay | Admitting: Gastroenterology

## 2013-09-05 VITALS — BP 124/74 | HR 88 | Ht 67.0 in | Wt 165.2 lb

## 2013-09-05 DIAGNOSIS — R195 Other fecal abnormalities: Secondary | ICD-10-CM

## 2013-09-05 LAB — CBC WITH DIFFERENTIAL/PLATELET
BASOS ABS: 0 10*3/uL (ref 0.0–0.1)
Basophils Relative: 0.3 % (ref 0.0–3.0)
EOS ABS: 0.2 10*3/uL (ref 0.0–0.7)
Eosinophils Relative: 2.5 % (ref 0.0–5.0)
HCT: 40.4 % (ref 36.0–46.0)
HEMOGLOBIN: 13.9 g/dL (ref 12.0–15.0)
LYMPHS PCT: 24.3 % (ref 12.0–46.0)
Lymphs Abs: 2.1 10*3/uL (ref 0.7–4.0)
MCHC: 34.4 g/dL (ref 30.0–36.0)
MCV: 91 fl (ref 78.0–100.0)
MONO ABS: 1 10*3/uL (ref 0.1–1.0)
Monocytes Relative: 11.3 % (ref 3.0–12.0)
NEUTROS ABS: 5.2 10*3/uL (ref 1.4–7.7)
Neutrophils Relative %: 61.6 % (ref 43.0–77.0)
Platelets: 324 10*3/uL (ref 150.0–400.0)
RBC: 4.44 Mil/uL (ref 3.87–5.11)
RDW: 13 % (ref 11.5–15.5)
WBC: 8.5 10*3/uL (ref 4.0–10.5)

## 2013-09-05 NOTE — Assessment & Plan Note (Signed)
Hemoccult-positive stool may reflect NSAID use causing erosions or active peptic ulcer disease.  Doubt colonic lesion in the face of relatively recent colonoscopy.  Recommendations #1 CBC #2 upper endoscopy

## 2013-09-05 NOTE — Patient Instructions (Signed)
Go to the basement for labs today  You have been scheduled for an endoscopy. Please follow written instructions given to you at your visit today. If you use inhalers (even only as needed), please bring them with you on the day of your procedure. Your physician has requested that you go to www.startemmi.com and enter the access code given to you at your visit today. This web site gives a general overview about your procedure. However, you should still follow specific instructions given to you by our office regarding your preparation for the procedure. 

## 2013-09-05 NOTE — Progress Notes (Signed)
_                                                                                                                History of Present Illness: Breanna Palmer 53 year old white female referred for Hemoccult-positive stool.  This was noted on routine testing.  Patient's only GI complaint is mild, intermittent constipation.  She rarely sees spots of blood on the toilet tissue.  Colonoscopy in 2012 was normal.  She does take NSAIDs at least once daily.  She denies pyrosis, abdominal pain or dysphagia.    Past Medical History  Diagnosis Date  . Seasonal allergies   . Arthritis   . Depression   . Diabetes mellitus   . Hyperlipidemia   . Fibromyalgia   . Anxiety   . NSVD (normal spontaneous vaginal delivery)     X2  . Missed ab   . Menopause     AGE 13   Past Surgical History  Procedure Laterality Date  . Vaginal hysterectomy  1997  . Knee arthroscopy  1998, 2008    right  . Foot surgery  2011    right  . Dilation and curettage of uterus    . Foot surgery Right     spurs   family history includes Breast cancer in her paternal aunt; Cancer in her father and paternal grandmother; Colon cancer (age of onset: 68) in her paternal grandmother; Diabetes in her mother and sister. Current Outpatient Prescriptions  Medication Sig Dispense Refill  . aspirin 81 MG tablet Take 81 mg by mouth daily.      . calcium carbonate 200 MG capsule Take by mouth 2 (two) times daily with a meal.        . Cholecalciferol (VITAMIN D) 2000 UNITS CAPS Take by mouth daily.        . DULoxetine (CYMBALTA) 60 MG capsule Take 60 mg by mouth daily.        Marland Kitchen METFORMIN HCL PO Take 500 mg by mouth 2 (two) times daily.       Marland Kitchen METOPROLOL TARTRATE PO Take by mouth daily.        . rosuvastatin (CRESTOR) 40 MG tablet Take 40 mg by mouth daily.      . clonazePAM (KLONOPIN) 0.5 MG tablet Take 1 tablet (0.5 mg total) by mouth 2 (two) times daily as needed for anxiety.  30 tablet  3   No current  facility-administered medications for this visit.   Allergies as of 09/05/2013 - Review Complete 09/05/2013  Allergen Reaction Noted  . Morphine and related Itching 02/19/2011    reports that she has been smoking.  She quit smokeless tobacco use about 29 years ago. She reports that she drinks about 1.2 ounces of alcohol per week. She reports that she does not use illicit drugs.     Review of Systems: Pertinent positive and negative review of systems were noted in the above HPI section. All other review of systems were otherwise negative.  Vital signs were reviewed in today's  medical record Physical Exam: General: Well developed , well nourished, no acute distress Skin: anicteric Head: Normocephalic and atraumatic Eyes:  sclerae anicteric, EOMI Ears: Normal auditory acuity Mouth: No deformity or lesions Neck: Supple, no masses or thyromegaly Lungs: Clear throughout to auscultation Heart: Regular rate and rhythm; no murmurs, rubs or bruits Abdomen: Soft, non tender and non distended. No masses, hepatosplenomegaly or hernias noted. Normal Bowel sounds Rectal:deferred Musculoskeletal: Symmetrical with no gross deformities  Skin: No lesions on visible extremities Pulses:  Normal pulses noted Extremities: No clubbing, cyanosis, edema or deformities noted Neurological: Alert oriented x 4, grossly nonfocal Cervical Nodes:  No significant cervical adenopathy Inguinal Nodes: No significant inguinal adenopathy Psychological:  Alert and cooperative. Normal mood and affect  See Assessment and Plan under Problem List

## 2013-09-08 ENCOUNTER — Encounter: Payer: Self-pay | Admitting: Gastroenterology

## 2013-10-04 ENCOUNTER — Encounter: Payer: BC Managed Care – PPO | Admitting: Gastroenterology

## 2013-10-25 ENCOUNTER — Telehealth: Payer: Self-pay | Admitting: Gastroenterology

## 2013-10-25 ENCOUNTER — Other Ambulatory Visit: Payer: Self-pay | Admitting: *Deleted

## 2013-10-25 NOTE — Telephone Encounter (Signed)
New instructions mailed today

## 2013-10-31 ENCOUNTER — Encounter: Payer: BC Managed Care – PPO | Admitting: Gastroenterology

## 2013-11-08 ENCOUNTER — Ambulatory Visit (AMBULATORY_SURGERY_CENTER): Payer: BC Managed Care – PPO | Admitting: Gastroenterology

## 2013-11-08 ENCOUNTER — Encounter: Payer: Self-pay | Admitting: Gastroenterology

## 2013-11-08 VITALS — BP 100/64 | HR 84 | Temp 97.9°F | Resp 27 | Ht 67.0 in | Wt 165.0 lb

## 2013-11-08 DIAGNOSIS — R195 Other fecal abnormalities: Secondary | ICD-10-CM

## 2013-11-08 DIAGNOSIS — K2990 Gastroduodenitis, unspecified, without bleeding: Secondary | ICD-10-CM

## 2013-11-08 DIAGNOSIS — K2970 Gastritis, unspecified, without bleeding: Secondary | ICD-10-CM

## 2013-11-08 DIAGNOSIS — K209 Esophagitis, unspecified without bleeding: Secondary | ICD-10-CM

## 2013-11-08 DIAGNOSIS — K2210 Ulcer of esophagus without bleeding: Secondary | ICD-10-CM

## 2013-11-08 LAB — GLUCOSE, CAPILLARY
Glucose-Capillary: 105 mg/dL — ABNORMAL HIGH (ref 70–99)
Glucose-Capillary: 93 mg/dL (ref 70–99)

## 2013-11-08 MED ORDER — SODIUM CHLORIDE 0.9 % IV SOLN: 500.0000 mL | INTRAVENOUS | Status: DC

## 2013-11-08 MED ORDER — PANTOPRAZOLE SODIUM 40 MG PO TBEC: 40.0000 mg | DELAYED_RELEASE_TABLET | Freq: Every day | ORAL | Status: DC

## 2013-11-08 NOTE — Op Note (Signed)
[facility]  [address] [place], [id number]   ENDOSCOPY PROCEDURE REPORT  PATIENT: Breanna Palmer, Breanna Palmer  MR#: [id number] BIRTHDATE: [birth year] , 1  yrs. old GENDER: Female ENDOSCOPIST: Inda Castle, MD REFERRED BY:  Delia Chimes, NP PROCEDURE DATE:  11/08/2013 PROCEDURE:  EGD, diagnostic ASA CLASS:     Class II INDICATIONS:  Occult blood positive. MEDICATIONS: MAC sedation, administered by CRNA and Propofol (Diprivan) 120 mg IV TOPICAL ANESTHETIC:  DESCRIPTION OF PROCEDURE: After the risks benefits and alternatives of the procedure were thoroughly explained, informed consent was obtained.  The LB [id number] endoscope was introduced through the mouth and advanced to the third portion of the duodenum. Without limitations.  The instrument was slowly withdrawn as the mucosa was fully examined.      In the gastric antrum there multiple superficial erosions.  Similar changes were seen in the duodenal bulb. There is a 1.5 cm superficial linear ulcer at the GE junction.   In the gastric antrum there multiple superficial erosions.  Similar changes were seen in the duodenal bulb. There is a 1.5 cm superficial linear ulcer at the GE junction.   The remainder of the upper endoscopy exam was otherwise normal. Retroflexed views revealed no abnormalities.     The scope was then withdrawn from the patient and the procedure completed.  COMPLICATIONS: There were no complications. ENDOSCOPIC IMPRESSION: 1.   esophageal ulcer 2 .  gastric and duodenal erosions 3.  The remainder of the upper endoscopy exam was otherwise normal  RECOMMENDATIONS: 1.  avoid NSAIDs 2.  begin Protonix 40 mg daily 3.  followup Hemoccults in one month REPEAT EXAM:  eSigned:  Inda Castle, MD 11/08/2013 10:00 AM   CC:  PATIENT NAME:  Iolani, Twilley MR#: [id number]

## 2013-11-08 NOTE — Progress Notes (Signed)
A/ox3 pleased with MAC, report to Tracy W RN 

## 2013-11-08 NOTE — Patient Instructions (Signed)
Findings:  Esophageal Ulcer, Gastric and Duodenal erosions Recommendations:  Avoid aspirin and aspirin products, Protonix 40 mg daily, follow up with hemoccult in one month. (Hemoccult cards given to pt and explained)    YOU HAD AN ENDOSCOPIC PROCEDURE TODAY AT [facility]: Refer to the procedure report that was given to you for any specific questions about what was found during the examination.  If the procedure report does not answer your questions, please call your gastroenterologist to clarify.  If you requested that your care partner not be given the details of your procedure findings, then the procedure report has been included in a sealed envelope for you to review at your convenience later.  YOU SHOULD EXPECT: Some feelings of bloating in the abdomen. Passage of more gas than usual.  Walking can help get rid of the air that was put into your GI tract during the procedure and reduce the bloating. If you had a lower endoscopy (such as a colonoscopy or flexible sigmoidoscopy) you may notice spotting of blood in your stool or on the toilet paper. If you underwent a bowel prep for your procedure, then you may not have a normal bowel movement for a few days.  DIET: Your first meal following the procedure should be a light meal and then it is ok to progress to your normal diet.  A half-sandwich or bowl of soup is an example of a good first meal.  Heavy or fried foods are harder to digest and may make you feel nauseous or bloated.  Likewise meals heavy in dairy and vegetables can cause extra gas to form and this can also increase the bloating.  Drink plenty of fluids but you should avoid alcoholic beverages for 24 hours.  ACTIVITY: Your care partner should take you home directly after the procedure.  You should plan to take it easy, moving slowly for the rest of the day.  You can resume normal activity the day after the procedure however you should NOT DRIVE or use heavy machinery for 24  hours (because of the sedation medicines used during the test).    SYMPTOMS TO REPORT IMMEDIATELY: A gastroenterologist can be reached at any hour.  During normal business hours, 8:30 AM to 5:00 PM Monday through Friday, call [phone].  After hours and on weekends, please call the GI answering service at [phone] who will take a message and have the physician on call contact you.   Following lower endoscopy (colonoscopy or flexible sigmoidoscopy):  Excessive amounts of blood in the stool  Significant tenderness or worsening of abdominal pains  Swelling of the abdomen that is new, acute  Fever of 100F or higher  Following upper endoscopy (EGD)  Vomiting of blood or coffee ground material  New chest pain or pain under the shoulder blades  Painful or persistently difficult swallowing  New shortness of breath  Fever of 100F or higher  Black, tarry-looking stools  FOLLOW UP: If any biopsies were taken you will be contacted by phone or by letter within the next 1-3 weeks.  Call your gastroenterologist if you have not heard about the biopsies in 3 weeks.  Our staff will call the home number listed on your records the next business day following your procedure to check on you and address any questions or concerns that you may have at that time regarding the information given to you following your procedure. This is a courtesy call and so if there is no answer at  the home number and we have not heard from you through the emergency physician on call, we will assume that you have returned to your regular daily activities without incident.  SIGNATURES/CONFIDENTIALITY: You and/or your care partner have signed paperwork which will be entered into your electronic medical record.  These signatures attest to the fact that that the information above on your After Visit Summary has been reviewed and is understood.  Full responsibility of the confidentiality of this discharge information lies with  you and/or your care-partner.  Please follow all discharge instructions given to you by the recovery room nurse. If you have any questions or problems after discharge please call one of the numbers listed above. You will receive a phone call in the am to see how you are doing and answer any questions you may have. Thank you for choosing [facility] for your health care needs.

## 2013-11-09 ENCOUNTER — Telehealth: Payer: Self-pay | Admitting: *Deleted

## 2013-11-09 NOTE — Telephone Encounter (Signed)
  Follow up Call-  Call back number 11/08/2013 03/04/2011  Post procedure Call Back phone  # [phone]  Permission to leave phone message Yes -     Patient questions:  Do you have a fever, pain , or abdominal swelling? No. Pain Score  0 *  Have you tolerated food without any problems? Yes.    Have you been able to return to your normal activities? Yes.    Do you have any questions about your discharge instructions: Diet   No. Medications  No. Follow up visit  No.  Do you have questions or concerns about your Care? No.  Actions: * If pain score is 4 or above: No action needed, pain <4.

## 2013-12-14 ENCOUNTER — Other Ambulatory Visit (INDEPENDENT_AMBULATORY_CARE_PROVIDER_SITE_OTHER): Payer: BC Managed Care – PPO

## 2013-12-14 ENCOUNTER — Other Ambulatory Visit: Payer: Self-pay | Admitting: *Deleted

## 2013-12-14 DIAGNOSIS — R195 Other fecal abnormalities: Secondary | ICD-10-CM

## 2013-12-15 LAB — HEMOCCULT SLIDES (X 3 CARDS)
FECAL OCCULT BLD: NEGATIVE
OCCULT 1: NEGATIVE
OCCULT 2: NEGATIVE
OCCULT 3: NEGATIVE
OCCULT 4: NEGATIVE
OCCULT 5: NEGATIVE

## 2013-12-18 ENCOUNTER — Telehealth: Payer: Self-pay

## 2013-12-18 NOTE — Telephone Encounter (Signed)
Called to patient to tell her of the normal hemoccult results. She reports she is a little better on Protonix but still has no appetite and has spells of terrible nausea. She wants to know what she is supposed to do and does she remain on protonix.

## 2013-12-18 NOTE — Progress Notes (Signed)
Quick Note:  Please inform the patient that Hemoccults were negative. No further GI workup is required. ______

## 2013-12-19 MED ORDER — OMEPRAZOLE 20 MG PO CPDR: DELAYED_RELEASE_CAPSULE | ORAL | Status: DC

## 2013-12-19 NOTE — Telephone Encounter (Signed)
.    Did  nausea precede Protonix?  If not then the proton X. could be causing nausea.  Try switching to omeprazole 20 mg daily. Is she still taking NSAIDs?  If so she should discontinue this

## 2013-12-19 NOTE — Telephone Encounter (Signed)
Ok

## 2013-12-19 NOTE — Telephone Encounter (Signed)
No nausea prior to Protonix and she is interested in trying Omeprazole. She mostly takes Tylenol but infrequent occasions she takes Advil with a meal for her back pain. She agrees to call back if she acutely worsens or fails to improve after changing to Omeprazole.

## 2014-01-15 ENCOUNTER — Encounter: Payer: Self-pay | Admitting: Gastroenterology

## 2014-04-26 ENCOUNTER — Other Ambulatory Visit: Payer: Self-pay

## 2014-04-30 ENCOUNTER — Other Ambulatory Visit: Payer: Self-pay | Admitting: Nurse Practitioner

## 2014-05-09 ENCOUNTER — Telehealth: Payer: Self-pay | Admitting: Gastroenterology

## 2014-05-09 ENCOUNTER — Other Ambulatory Visit: Payer: Self-pay

## 2014-05-09 MED ORDER — PANTOPRAZOLE SODIUM 40 MG PO TBEC: 40.0000 mg | DELAYED_RELEASE_TABLET | Freq: Every day | ORAL | Status: DC

## 2014-05-09 NOTE — Telephone Encounter (Signed)
I have left message for the patient to call back 

## 2014-05-09 NOTE — Telephone Encounter (Signed)
Patient reports she has changed her mind. Her symptoms were under better controlled on Protonix 40mg  daily. She had resumed the medication and stopped her Omeprazole. She is asking for a new Rx. I have called the pharmacy and made the switch for her.

## 2014-05-13 NOTE — Telephone Encounter (Signed)
Ok to refill protonix 

## 2014-05-22 ENCOUNTER — Ambulatory Visit (INDEPENDENT_AMBULATORY_CARE_PROVIDER_SITE_OTHER): Payer: BC Managed Care – PPO | Admitting: Gynecology

## 2014-05-22 ENCOUNTER — Encounter: Payer: Self-pay | Admitting: Gynecology

## 2014-05-22 ENCOUNTER — Other Ambulatory Visit (HOSPITAL_COMMUNITY)
Admission: RE | Admit: 2014-05-22 | Discharge: 2014-05-22 | Disposition: A | Discharge status: Home / Self Care | Payer: BC Managed Care – PPO | Source: Ambulatory Visit | Attending: Gynecology | Admitting: Gynecology

## 2014-05-22 VITALS — BP 120/76 | Ht 66.0 in | Wt 166.0 lb

## 2014-05-22 DIAGNOSIS — Z01419 Encounter for gynecological examination (general) (routine) without abnormal findings: Secondary | ICD-10-CM | POA: Insufficient documentation

## 2014-05-22 DIAGNOSIS — Z9189 Other specified personal risk factors, not elsewhere classified: Secondary | ICD-10-CM

## 2014-05-22 DIAGNOSIS — Z1151 Encounter for screening for human papillomavirus (HPV): Secondary | ICD-10-CM | POA: Insufficient documentation

## 2014-05-22 NOTE — Addendum Note (Signed)
Addended by: Nelva Nay on: 05/22/2014 09:04 AM   Modules accepted: Orders

## 2014-05-22 NOTE — Progress Notes (Signed)
Breanna Palmer [birth year] [id number]   History:    54 y.o.  for annual gyn exam with no complaints today.She was being followed by Delia Chimes, NP who had drawn on her lab work recently.Toy Cookey, NP who had drawn on her lab work recently who has been treating her also for type 2 diabetes and hypertension as well as hypercholesterolemia. She takes Klonopin 0.5 mg daily when necessary as necessary for anxiety. 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 Patient with no past history of any abnormal Pap smear. Patient with past history of transvaginal hysterectomy in 1997 without BSO. She is on no hormone replacement therapy and uses when necessary vaginal gel during intercourse. Bone density study 2015 demonstrated the lowest T score was -1.2 at the AP spine and the right femoral neck was -1.1 with normal Frax analysis. She reports normal colonoscopy in 2012. Both parents have history of colon polyps. She is on a 10 year cycle.  Past medical history,surgical history, family history and social history were all reviewed and documented in the EPIC chart.  Gynecologic History No LMP recorded. Patient has had a hysterectomy. Contraception: status post hysterectomy Last Pap: 2012. Results were: normal Last mammogram: 2016. Results were: normal  Obstetric History OB History  Gravida Para Term Preterm AB SAB TAB Ectopic Multiple Living  3 2 2  1 1    2     # Outcome Date GA Lbr Len/2nd Weight Sex Delivery Anes PTL Lv  3 SAB           2 Term     F Vag-Spont  N Y  1 Term     M Vag-Spont  N Y       ROS: A ROS was performed and pertinent positives and negatives are included in the history.  GENERAL: No fevers or chills. HEENT: No change in vision, no earache, sore throat or sinus congestion. NECK: No pain or stiffness. CARDIOVASCULAR: No chest pain or pressure. No palpitations. PULMONARY: No shortness of breath, cough or wheeze. GASTROINTESTINAL: No abdominal  pain, nausea, vomiting or diarrhea, melena or bright red blood per rectum. GENITOURINARY: No urinary frequency, urgency, hesitancy or dysuria. MUSCULOSKELETAL: No joint or muscle pain, no back pain, no recent trauma. DERMATOLOGIC: No rash, no itching, no lesions. ENDOCRINE: No polyuria, polydipsia, no heat or cold intolerance. No recent change in weight. HEMATOLOGICAL: No anemia or easy bruising or bleeding. NEUROLOGIC: No headache, seizures, numbness, tingling or weakness. PSYCHIATRIC: No depression, no loss of interest in normal activity or change in sleep pattern.     Exam: chaperone present  BP 120/76 mmHg  Ht 5\' 6"  (1.676 m)  Wt 166 lb (75.297 kg)  BMI 26.81 kg/m2  Body mass index is 26.81 kg/(m^2).  General appearance : Well developed well nourished female. No acute distress HEENT: Eyes: no retinal hemorrhage or exudates,  Neck supple, trachea midline, no carotid bruits, no thyroidmegaly Lungs: Clear to auscultation, no rhonchi or wheezes, or rib retractions  Heart: Regular rate and rhythm, no murmurs or gallops Breast:Examined in sitting and supine position were symmetrical in appearance, no palpable masses or tenderness,  no skin retraction, no nipple inversion, no nipple discharge, no skin discoloration, no axillary or supraclavicular lymphadenopathy Abdomen: no palpable masses or tenderness, no rebound or guarding Extremities: no edema or skin discoloration or tenderness  Pelvic:  Bartholin, Urethra, Skene Glands: Within normal limits  Vagina: No gross lesions or discharge  Cervix: Absent  Uterus  absent  Adnexa  Without masses or tenderness  Anus and perineum  normal   Rectovaginal  normal sphincter tone without palpated masses or tenderness             Hemoccult PCP will provide     Assessment/Plan:  54 y.o. female for annual exam with no abnormalities detected on annual exam. 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 Blood work will be drawn by her PCP. Her vaccines are up-to-date. She was reminded do her monthly breast exam. We discussed importance of calcium vitamin D and regular exercise for osteoporosis prevention.   Terrance Mass MD, 8:58 AM 05/22/2014

## 2014-05-23 ENCOUNTER — Encounter: Payer: Self-pay | Admitting: Gynecology

## 2014-05-23 LAB — CYTOLOGY - PAP

## 2014-11-28 ENCOUNTER — Telehealth: Payer: Self-pay | Admitting: *Deleted

## 2014-11-28 MED ORDER — PANTOPRAZOLE SODIUM 40 MG PO TBEC: 40.0000 mg | DELAYED_RELEASE_TABLET | Freq: Every day | ORAL | Status: DC

## 2014-11-28 NOTE — Telephone Encounter (Signed)
Fax request from pharmacy e prescribed protonix

## 2014-12-31 ENCOUNTER — Other Ambulatory Visit: Payer: Self-pay

## 2015-01-07 ENCOUNTER — Telehealth: Payer: Self-pay | Admitting: Internal Medicine

## 2015-01-07 NOTE — Telephone Encounter (Signed)
Per pt she would like to continue her care with Dr. Hilarie Fredrickson. Will you accept?

## 2015-01-07 NOTE — Telephone Encounter (Signed)
ok 

## 2015-01-14 NOTE — Telephone Encounter (Signed)
Patient aware she can schedule with Dr. Hilarie Fredrickson whenever she is ready.

## 2015-01-18 ENCOUNTER — Telehealth: Payer: Self-pay | Admitting: Internal Medicine

## 2015-01-18 NOTE — Telephone Encounter (Signed)
I have advised patient that per chart, it appears in September, she wanted to switch from omeprazole back to pantoprazole since the omeprazole was not helping her. #90 w/3 refills were sent to [facility]. Patient said she would call pharmacy for refill.

## 2015-01-21 ENCOUNTER — Telehealth: Payer: Self-pay | Admitting: Internal Medicine

## 2015-01-21 MED ORDER — PANTOPRAZOLE SODIUM 40 MG PO TBEC: 40.0000 mg | DELAYED_RELEASE_TABLET | Freq: Every day | ORAL | Status: DC

## 2015-01-21 NOTE — Telephone Encounter (Signed)
Rx sent. Needs office visit for further refills. 

## 2015-03-05 ENCOUNTER — Encounter: Payer: Self-pay | Admitting: *Deleted

## 2015-03-22 ENCOUNTER — Ambulatory Visit (INDEPENDENT_AMBULATORY_CARE_PROVIDER_SITE_OTHER): Payer: BC Managed Care – PPO | Admitting: Internal Medicine

## 2015-03-22 ENCOUNTER — Encounter: Payer: Self-pay | Admitting: Internal Medicine

## 2015-03-22 VITALS — BP 122/60 | HR 100 | Ht 66.0 in | Wt 160.0 lb

## 2015-03-22 DIAGNOSIS — K219 Gastro-esophageal reflux disease without esophagitis: Secondary | ICD-10-CM

## 2015-03-22 MED ORDER — PANTOPRAZOLE SODIUM 40 MG PO TBEC: 40.0000 mg | DELAYED_RELEASE_TABLET | Freq: Every day | ORAL | Status: DC

## 2015-03-22 NOTE — Patient Instructions (Signed)
We have sent the following prescriptions to your mail in pharmacy: Pantoprazole 40 mg daily  If you have not heard from your mail in pharmacy within 1 week or if you have not received your medication in the mail, please contact us at [phone] so we may find out why.  Please purchase the following medications over the counter and take as directed: Gaviscon (for breakthrough reflux) as needed  Please follow up with Dr Hilarie Fredrickson in 1 year.

## 2015-03-22 NOTE — Progress Notes (Signed)
Patient ID: Breanna Palmer, female   DOB: 11-10-1960, 54 y.o.   MRN: [id number] HPI: Breanna Palmer is a 55 year old female previously under the care of Dr. Deatra Palmer who is seen for follow-up today for her history of reflux esophagitis. She is here alone today and this is her first visit with me. She also has a history of hyperlipidemia and diabetes. She reports that she has been doing well on pantoprazole 40 mg daily. She denies dysphagia or odynophagia. For her her reflux symptoms is been primarily nausea and uncomfortable feeling in the epigastrium and chest. Occasionally she has breakthrough symptoms and cannot pin this to a certain time of day or dietary intake. No early satiety or weight loss. No true vomiting. Bowel habits are regular without blood in her stool or melena.  She had an upper endoscopy in August 2015 which showed reflux esophagitis without Barrett's esophagus. Screening colonoscopy was performed in 2012 and was normal with recommended 10 year interval. Family history of colon cancer in grandmother but no first degree relative. She lives in [place] and denies tobacco use currently.  Social alcohol use.  Past Medical History  Diagnosis Date  . Seasonal allergies   . Arthritis   . Depression   . Diabetes mellitus   . Hyperlipidemia   . Fibromyalgia   . Anxiety   . NSVD (normal spontaneous vaginal delivery)     X2  . Missed ab   . Menopause     AGE 30  . Sleep apnea     possible sleep apnea, was using a CPAP at one time, but not now  . Esophageal ulcer     Past Surgical History  Procedure Laterality Date  . Vaginal hysterectomy  1997  . Knee arthroscopy  1998, 2008    right  . Foot surgery  2011    right  . Dilation and curettage of uterus    . Foot surgery Right     spurs    Outpatient Prescriptions Prior to Visit  Medication Sig Dispense Refill  . calcium carbonate 200 MG capsule Take by mouth 2 (two) times daily with a meal.      . METFORMIN HCL PO Take  500 mg by mouth 2 (two) times daily.     Marland Kitchen METOPROLOL TARTRATE PO Take by mouth daily.      . niacin (NIASPAN) 1000 MG CR tablet at bedtime.    . rosuvastatin (CRESTOR) 40 MG tablet Take 40 mg by mouth daily.    . Cholecalciferol (VITAMIN D) 2000 UNITS CAPS Take by mouth daily.      . pantoprazole (PROTONIX) 40 MG tablet Take 1 tablet (40 mg total) by mouth daily. 90 tablet 0  . clonazePAM (KLONOPIN) 0.5 MG tablet Take 1 tablet (0.5 mg total) by mouth 2 (two) times daily as needed for anxiety. 30 tablet 3   No facility-administered medications prior to visit.    Allergies  Allergen Reactions  . Morphine And Related Itching    Family History  Problem Relation Age of Onset  . Colon cancer Paternal Grandmother 57  . Diabetes Mother   . Diabetes Sister   . Cancer Father     prostate  . Breast cancer Paternal Aunt 90  . Esophageal cancer Neg Hx   . Stomach cancer Neg Hx   . Rectal cancer Neg Hx     Social History  Substance Use Topics  . Smoking status: Former Smoker    Quit date: 10/20/2010  . Smokeless  tobacco: Former Systems developer    Quit date: 02/19/1984  . Alcohol Use: 1.2 oz/week    2 Cans of beer per week     Comment: weekends    ROS: As per history of present illness, otherwise negative  BP 122/60 mmHg  Pulse 100  Ht '5\' 6"'  (1.676 m)  Wt 160 lb (72.576 kg)  BMI 25.84 kg/m2 Constitutional: Well-developed and well-nourished. No distress. HEENT: Normocephalic and atraumatic. Oropharynx is clear and moist. No oropharyngeal exudate. Conjunctivae are normal.  No scleral icterus. Neck: Neck supple. Trachea midline. Cardiovascular: Normal rate, regular rhythm and intact distal pulses. No M/R/G Pulmonary/chest: Effort normal and breath sounds normal. No wheezing, rales or rhonchi. Abdominal: Soft, nontender, nondistended. Bowel sounds active throughout. There are no masses palpable. No hepatosplenomegaly. Extremities: no clubbing, cyanosis, or edema Lymphadenopathy: No cervical  adenopathy noted. Neurological: Alert and oriented to person place and time. Skin: Skin is warm and dry. No rashes noted. Psychiatric: Normal mood and affect. Behavior is normal.  RELEVANT LABS AND IMAGING: CBC    Component Value Date/Time   WBC 8.5 09/05/2013 0908   RBC 4.44 09/05/2013 0908   HGB 13.9 09/05/2013 0908   HCT 40.4 09/05/2013 0908   PLT 324.0 09/05/2013 0908   MCV 91.0 09/05/2013 0908   MCHC 34.4 09/05/2013 0908   RDW 13.0 09/05/2013 0908   LYMPHSABS 2.1 09/05/2013 0908   MONOABS 1.0 09/05/2013 0908   EOSABS 0.2 09/05/2013 0908   BASOSABS 0.0 09/05/2013 0908    CMP     Component Value Date/Time   NA 135 01/14/2009 1300   K 4.2 01/14/2009 1300   CL 100 01/14/2009 1300   CO2 28 01/14/2009 1300   GLUCOSE 100* 01/14/2009 1300   BUN 13 01/14/2009 1300   CREATININE 0.84 01/14/2009 1300   CALCIUM 9.3 01/14/2009 1300   PROT 6.8 01/14/2009 1300   ALBUMIN 4.1 01/14/2009 1300   AST 23 01/14/2009 1300   ALT 27 01/14/2009 1300   ALKPHOS 60 01/14/2009 1300   BILITOT 0.6 01/14/2009 1300   GFRNONAA >60 01/14/2009 1300   GFRAA  01/14/2009 1300    >60        The eGFR has been calculated using the MDRD equation. This calculation has not been validated in all clinical situations. eGFR's persistently <60 mL/min signify possible Chronic Kidney Disease.    ASSESSMENT/PLAN: 55 year old female with history of GERD  1. GERD -- history esophagitis without symptoms now. No alarm symptoms. Continue pantoprazole 40 mg daily. Can use Gaviscon OTC per bottle instructions for breakthrough symptoms. If breakthrough occurring more frequently notify me. Follow-up one year  2. CRC screening -- up-to-date repeat colonoscopy recommended 2022    AC:[address], [place] [place], [place] [address]

## 2015-05-23 ENCOUNTER — Encounter: Payer: BC Managed Care – PPO | Admitting: Gynecology

## 2015-07-03 ENCOUNTER — Encounter: Payer: BC Managed Care – PPO | Admitting: Gynecology

## 2015-08-14 ENCOUNTER — Ambulatory Visit (INDEPENDENT_AMBULATORY_CARE_PROVIDER_SITE_OTHER): Payer: BC Managed Care – PPO | Admitting: Gynecology

## 2015-08-14 ENCOUNTER — Encounter: Payer: Self-pay | Admitting: Gynecology

## 2015-08-14 VITALS — BP 132/86 | Ht 65.5 in | Wt 160.3 lb

## 2015-08-14 DIAGNOSIS — M8580 Other specified disorders of bone density and structure, unspecified site: Secondary | ICD-10-CM

## 2015-08-14 DIAGNOSIS — Z780 Asymptomatic menopausal state: Secondary | ICD-10-CM

## 2015-08-14 DIAGNOSIS — Z01419 Encounter for gynecological examination (general) (routine) without abnormal findings: Secondary | ICD-10-CM | POA: Diagnosis not present

## 2015-08-14 MED ORDER — ESTRADIOL 10 MCG VA TABS: 1.0000 | ORAL_TABLET | VAGINAL | Status: DC

## 2015-08-14 NOTE — Progress Notes (Signed)
Breanna Palmer [birth year] [id number]   History:    55 y.o.  for annual gyn exam only vaginal dryness.She was being followed by Delia Chimes, NP who had drawn on her lab work recently.Toy Cookey, NP who had drawn on her lab work recently who has been treating her also for type 2 diabetes and hypertension as well as hypercholesterolemia. She takes Klonopin 0.5 mg daily when necessary as necessary for anxiety. 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 Patient with no past history of any abnormal Pap smear. Patient with past history of transvaginal hysterectomy in 1997 without BSO. She is on no hormone replacement therapy and uses when necessary vaginal gel during intercourse. Bone density study 2015 demonstrated the lowest T score was -1.2 at the AP spine and the right femoral neck was -1.1 with normal Frax analysis. She reports normal colonoscopy in 2012. Both parents have history of colon polyps. She is on a 10 year cycle.  Past medical history,surgical history, family history and social history were all reviewed and documented in the EPIC chart.  Gynecologic History No LMP recorded. Patient has had a hysterectomy. Contraception: post menopausal status Last Pap: 2016. Results were: normal Last mammogram: Today. Results were: Results pending  Obstetric History OB History  Gravida Para Term Preterm AB SAB TAB Ectopic Multiple Living  3 2 2  1 1    2     # Outcome Date GA Lbr Len/2nd Weight Sex Delivery Anes PTL Lv  3 SAB           2 Term     F Vag-Spont  N Y  1 Term     M Vag-Spont  N Y       ROS: A ROS was performed and pertinent positives and negatives are included in the history.  GENERAL: No fevers or chills. HEENT: No change in vision, no earache, sore throat or sinus congestion. NECK: No pain or stiffness. CARDIOVASCULAR: No chest pain or pressure. No palpitations. PULMONARY: No shortness of breath, cough or wheeze. GASTROINTESTINAL: No  abdominal pain, nausea, vomiting or diarrhea, melena or bright red blood per rectum. GENITOURINARY: No urinary frequency, urgency, hesitancy or dysuria. MUSCULOSKELETAL: No joint or muscle pain, no back pain, no recent trauma. DERMATOLOGIC: No rash, no itching, no lesions. ENDOCRINE: No polyuria, polydipsia, no heat or cold intolerance. No recent change in weight. HEMATOLOGICAL: No anemia or easy bruising or bleeding. NEUROLOGIC: No headache, seizures, numbness, tingling or weakness. PSYCHIATRIC: No depression, no loss of interest in normal activity or change in sleep pattern.     Exam: chaperone present  BP 132/86 mmHg  Ht 5' 5.5" (1.664 m)  Wt 160 lb 4.8 oz (72.712 kg)  BMI 26.26 kg/m2  Body mass index is 26.26 kg/(m^2).  General appearance : Well developed well nourished female. No acute distress HEENT: Eyes: no retinal hemorrhage or exudates,  Neck supple, trachea midline, no carotid bruits, no thyroidmegaly Lungs: Clear to auscultation, no rhonchi or wheezes, or rib retractions  Heart: Regular rate and rhythm, no murmurs or gallops Breast:Examined in sitting and supine position were symmetrical in appearance, no palpable masses or tenderness,  no skin retraction, no nipple inversion, no nipple discharge, no skin discoloration, no axillary or supraclavicular lymphadenopathy Abdomen: no palpable masses or tenderness, no rebound or guarding Extremities: no edema or skin discoloration or tenderness  Pelvic:  Bartholin, Urethra, Skene Glands: Within normal limits  Vagina: No gross lesions or discharge, atrophic changes  Cervix: Absent  Uterus absent  Adnexa  Without masses or tenderness  Anus and perineum  normal   Rectovaginal  normal sphincter tone without palpated masses or tenderness             Hemoccult PCP provides     Assessment/Plan:  55 y.o. female for annual exam will be prescribed Vagifem 10 g to apply intravaginally twice a week for vaginal atrophy.  Literature information provided. Risk benefits and pros and cons discussed. PCP has been doing her blood work. Pap smear not indicated this year. We discussed importance of calcium vitamin D and weightbearing exercises for osteoporosis prevention. Patient to schedule bone density study next few weeks here in the office.   Terrance Mass MD, 10:08 AM 08/14/2015

## 2015-08-14 NOTE — Patient Instructions (Signed)

## 2015-08-19 ENCOUNTER — Encounter: Payer: Self-pay | Admitting: Gynecology

## 2015-08-23 ENCOUNTER — Telehealth: Payer: Self-pay | Admitting: *Deleted

## 2015-08-23 ENCOUNTER — Encounter: Payer: Self-pay | Admitting: Gynecology

## 2015-08-23 NOTE — Telephone Encounter (Signed)
Pt called requesting ICD-10 code for dexa, per note M85.80 given to patient.

## 2015-08-27 ENCOUNTER — Ambulatory Visit (INDEPENDENT_AMBULATORY_CARE_PROVIDER_SITE_OTHER): Payer: BC Managed Care – PPO

## 2015-08-27 ENCOUNTER — Other Ambulatory Visit: Payer: Self-pay | Admitting: Gynecology

## 2015-08-27 DIAGNOSIS — M899 Disorder of bone, unspecified: Secondary | ICD-10-CM

## 2015-08-27 DIAGNOSIS — Z13820 Encounter for screening for osteoporosis: Secondary | ICD-10-CM

## 2015-08-27 DIAGNOSIS — Z780 Asymptomatic menopausal state: Secondary | ICD-10-CM

## 2015-08-27 DIAGNOSIS — M8580 Other specified disorders of bone density and structure, unspecified site: Secondary | ICD-10-CM

## 2016-03-07 ENCOUNTER — Other Ambulatory Visit: Payer: Self-pay | Admitting: Internal Medicine

## 2016-04-17 ENCOUNTER — Ambulatory Visit (INDEPENDENT_AMBULATORY_CARE_PROVIDER_SITE_OTHER): Payer: BC Managed Care – PPO

## 2016-04-17 ENCOUNTER — Ambulatory Visit (INDEPENDENT_AMBULATORY_CARE_PROVIDER_SITE_OTHER): Payer: BC Managed Care – PPO | Admitting: Podiatry

## 2016-04-17 ENCOUNTER — Encounter: Payer: Self-pay | Admitting: Podiatry

## 2016-04-17 VITALS — Resp 16 | Ht 67.0 in | Wt 150.0 lb

## 2016-04-17 DIAGNOSIS — M205X9 Other deformities of toe(s) (acquired), unspecified foot: Secondary | ICD-10-CM | POA: Diagnosis not present

## 2016-04-17 DIAGNOSIS — M25571 Pain in right ankle and joints of right foot: Secondary | ICD-10-CM | POA: Diagnosis not present

## 2016-04-17 DIAGNOSIS — M778 Other enthesopathies, not elsewhere classified: Secondary | ICD-10-CM

## 2016-04-17 DIAGNOSIS — M779 Enthesopathy, unspecified: Secondary | ICD-10-CM

## 2016-04-17 DIAGNOSIS — M7750 Other enthesopathy of unspecified foot: Secondary | ICD-10-CM | POA: Diagnosis not present

## 2016-04-17 DIAGNOSIS — M79671 Pain in right foot: Secondary | ICD-10-CM

## 2016-04-17 DIAGNOSIS — M79672 Pain in left foot: Secondary | ICD-10-CM

## 2016-04-17 MED ORDER — TRIAMCINOLONE ACETONIDE 10 MG/ML IJ SUSP
10.0000 mg | Freq: Once | INTRAMUSCULAR | Status: AC
  Administered 2016-04-17: 10 mg

## 2016-04-17 NOTE — Progress Notes (Signed)
   Subjective:    Patient ID: Breanna Palmer, female    DOB: [birth year], 56 y.o.   MRN: [id number]  HPI  Chief Complaint  Patient presents with  . Foot Pain    Left; Top of foot beneath great toe x 6+ months. Right; lateral side x 1 month.   . Ankle Pain    Right; Lateral Side x 1 month. Pt states that the ankle started swelling this past week.        Review of Systems     Objective:   Physical Exam        Assessment & Plan:

## 2016-04-17 NOTE — Patient Instructions (Signed)

## 2016-04-18 NOTE — Progress Notes (Addendum)
Subjective:     Patient ID: Breanna Palmer, female   DOB: [birth year], 56 y.o.   MRN: [id number]  HPI patient states I'm getting a lot of pain around my left big toe joint like my right one which was fixed and also I have pain in my right ankle and foot. Patient states this big toe joint has been getting worse over the last 6 months   Review of Systems  All other systems reviewed and are negative.      Objective:   Physical Exam  Constitutional: She is oriented to person, place, and time.  Cardiovascular: Intact distal pulses.   Musculoskeletal: Normal range of motion.  Neurological: She is oriented to person, place, and time.  Skin: Skin is warm.  Nursing note and vitals reviewed.  neurovascular status intact muscle strength was adequate range of motion within normal limits with patient found to have limited range of motion first MPJ left with crepitus within the joint spur formation and pain. On the right noted to have well-healed surgical site with good range of motion first MPJ and quite a bit of pain in the right sinus tarsi with inflammation fluid also lateral side of the foot. 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 Patient wants to have surgery left and I allowed her to read consent form going over alternative treatments complications and the fact that ultimate fusion or implant is always possible. She wants surgery and will have biplanar osteotomy with removal of spurs and is scheduled for outpatient surgery. She continues to have moderate pain in the right first MPJ sore getting go ahead and remove the pins from  the right first metatarsal at the same time to try to reduce any inflammation that might be present. I did inject the right sinus tarsi 3 mg Kenalog 5 mill grams Xylocaine and dispensed boot therapy for the left and patient will begin utilizing this prior to surgery and we will schedule for outpatient surgery  X-ray report indicates on the left there is spur formation first metatarsal with narrowing of the joint surface and on the right there is 2 pin formation with good open joint space with indications that there is been previous arthritic process

## 2016-04-27 ENCOUNTER — Ambulatory Visit: Payer: BC Managed Care – PPO | Admitting: Podiatry

## 2016-05-26 ENCOUNTER — Telehealth: Payer: Self-pay | Admitting: *Deleted

## 2016-05-26 NOTE — Telephone Encounter (Signed)
"  I was waiting on your call to see if he could do my surgery this afternoon.  "He cannot do it today.  Dr. Paulla Dolly had already left the surgical center for the day.  They rescheduled you to next Tuesday.  You will have to be there at 9:15 am.  "That date will be fine.  What time will surgery be?"  Surgery will be at 10:15 am.

## 2016-06-02 ENCOUNTER — Encounter: Payer: Self-pay | Admitting: Podiatry

## 2016-06-02 DIAGNOSIS — M2042 Other hammer toe(s) (acquired), left foot: Secondary | ICD-10-CM | POA: Diagnosis not present

## 2016-06-02 DIAGNOSIS — Z4889 Encounter for other specified surgical aftercare: Secondary | ICD-10-CM | POA: Diagnosis not present

## 2016-06-02 DIAGNOSIS — M722 Plantar fascial fibromatosis: Secondary | ICD-10-CM | POA: Diagnosis not present

## 2016-06-02 DIAGNOSIS — M2022 Hallux rigidus, left foot: Secondary | ICD-10-CM | POA: Diagnosis not present

## 2016-06-03 ENCOUNTER — Other Ambulatory Visit: Payer: BC Managed Care – PPO

## 2016-06-08 ENCOUNTER — Ambulatory Visit (INDEPENDENT_AMBULATORY_CARE_PROVIDER_SITE_OTHER): Payer: BC Managed Care – PPO

## 2016-06-08 ENCOUNTER — Ambulatory Visit (INDEPENDENT_AMBULATORY_CARE_PROVIDER_SITE_OTHER): Payer: BC Managed Care – PPO | Admitting: Podiatry

## 2016-06-08 VITALS — Temp 98.4°F

## 2016-06-08 DIAGNOSIS — M205X9 Other deformities of toe(s) (acquired), unspecified foot: Secondary | ICD-10-CM | POA: Diagnosis not present

## 2016-06-08 DIAGNOSIS — M7750 Other enthesopathy of unspecified foot: Secondary | ICD-10-CM

## 2016-06-08 DIAGNOSIS — Z9889 Other specified postprocedural states: Secondary | ICD-10-CM | POA: Diagnosis not present

## 2016-06-08 DIAGNOSIS — M778 Other enthesopathies, not elsewhere classified: Secondary | ICD-10-CM

## 2016-06-08 DIAGNOSIS — M779 Enthesopathy, unspecified: Secondary | ICD-10-CM

## 2016-06-09 ENCOUNTER — Telehealth: Payer: Self-pay | Admitting: *Deleted

## 2016-06-09 MED ORDER — HYDROCODONE-ACETAMINOPHEN 10-325 MG PO TABS: 1.0000 | ORAL_TABLET | Freq: Three times a day (TID) | ORAL | 0 refills | Status: DC | PRN

## 2016-06-09 NOTE — Progress Notes (Signed)
Subjective:     Patient ID: Breanna Palmer, female   DOB: 1960-09-09, 56 y.o.   MRN: [id number]  HPI patient presents stating the left big toe joint has been doing well and she's walking with minimal discomfort or swelling   Review of Systems     Objective:   Physical Exam Neurovascular status intact negative Homans sign noted with well-healing surgical site first metatarsal left with wound edges well coapted and hallux in rectus position. Patient has good dorsi flexion with no crepitus of the joint noted and mild restriction plantar    Assessment:     Doing well post osteotomy first metatarsal left with wound edges well coapted and satisfactory recovery    Plan:     X-ray reviewed with patient and patient is instructed on continued elevation immobilization compression. Patient will be seen back to recheck again in the next 2 weeks and was encouraged to come in if any questions should occur.  X-ray indicated good healing of the osteotomy with good joint congruence and satisfactory removal of pins right. Pin in the left is in good position

## 2016-06-09 NOTE — Telephone Encounter (Signed)
Patient stopped by this afternoon. I helped patient with their bio skin on their foot. Patient had surgery on 06/02/16 and asked if they could have a refill for Hydrocodone-acetiminophen 10-325.  I asked Dr. Paulla Dolly if patient could have refill. He said yes. I printed out a prescription for patient. I called patient at [phone] (cell  #) to let them know prescription was approved. Patient stated, "Thank you. I will be by tomorrow morning (06/10/16) to pickup". I put the prescription at the front desk to be picked up.

## 2016-06-09 NOTE — Addendum Note (Signed)
Addended by: Celene Skeen A on: 06/09/2016 05:48 PM   Modules accepted: Orders

## 2016-06-22 ENCOUNTER — Ambulatory Visit (INDEPENDENT_AMBULATORY_CARE_PROVIDER_SITE_OTHER): Payer: Self-pay | Admitting: Podiatry

## 2016-06-22 ENCOUNTER — Ambulatory Visit (INDEPENDENT_AMBULATORY_CARE_PROVIDER_SITE_OTHER): Payer: BC Managed Care – PPO

## 2016-06-22 DIAGNOSIS — Z9889 Other specified postprocedural states: Secondary | ICD-10-CM | POA: Diagnosis not present

## 2016-06-22 DIAGNOSIS — M205X9 Other deformities of toe(s) (acquired), unspecified foot: Secondary | ICD-10-CM

## 2016-06-22 NOTE — Progress Notes (Signed)
Subjective:     Patient ID: Breanna Palmer, female   DOB: 11/16/1960, 56 y.o.   MRN: [id number]  HPI patient states she's doing well overall but admits that she's not been following directions and has been walking barefoot on her foot   Review of Systems     Objective:   Physical Exam Neurovascular status intact with wound edges found to be well coapted and good range of motion first MPJ with mild edema and no crepitus of the joint noted    Assessment:     Noncompliant patient with inflammation pain of a mild nature    Plan:     H&P x-rays reviewed and stitches removed. I went ahead today and advised on anti-inflammatories supportive shoe gear and gradual increase in activity starting the next few weeks. She'll be seen back 4 weeks and will continue with immobilization for the most part during that period  X-ray indicates the osteotomy is healing well with pins in place joint congruence and no spurring

## 2016-06-24 ENCOUNTER — Ambulatory Visit: Payer: BC Managed Care – PPO | Admitting: Podiatry

## 2016-06-29 ENCOUNTER — Ambulatory Visit (INDEPENDENT_AMBULATORY_CARE_PROVIDER_SITE_OTHER): Payer: BC Managed Care – PPO

## 2016-06-29 ENCOUNTER — Ambulatory Visit (INDEPENDENT_AMBULATORY_CARE_PROVIDER_SITE_OTHER): Payer: Self-pay | Admitting: Podiatry

## 2016-06-29 DIAGNOSIS — Z9889 Other specified postprocedural states: Secondary | ICD-10-CM

## 2016-06-29 DIAGNOSIS — M205X9 Other deformities of toe(s) (acquired), unspecified foot: Secondary | ICD-10-CM

## 2016-06-29 DIAGNOSIS — M779 Enthesopathy, unspecified: Secondary | ICD-10-CM

## 2016-06-29 NOTE — Progress Notes (Signed)
Subjective:     Patient ID: Breanna Palmer, female   DOB: [birth year], 56 y.o.   MRN: [id number]  HPI patient presents with pain in the left second digit with rotating formation that is slightly E long gaited and has continued discomfort in the lateral side of the right foot with possibility for stress fracture   Review of Systems     Objective:   Physical Exam Neurovascular status intact muscle strength adequate with patient found to have well-healing surgical sites left with wound edges well coapted hallux in rectus position with good range of motion and pin that has moved slightly distal second digit with pain in the lateral dorsal side of the right foot    Assessment:     Abnormal pin position second digit left with lateral pain dorsally and the right foot and good structural correction left    Plan:     H&P conditions reviewed pin removal second digit left with sterile dressing application and reviewed x-rays of both feet indicating no stress fracture right but she will start using ice and we'll utilize the boot that was used on the left foot  X-ray report indicates that the left foot is healing well with wound edges well coapted and good alignment and the right foot shows no signs of acute stress fracture

## 2016-07-27 ENCOUNTER — Ambulatory Visit (INDEPENDENT_AMBULATORY_CARE_PROVIDER_SITE_OTHER): Payer: BC Managed Care – PPO | Admitting: Podiatry

## 2016-07-27 ENCOUNTER — Ambulatory Visit: Payer: BC Managed Care – PPO | Admitting: Podiatry

## 2016-07-27 ENCOUNTER — Ambulatory Visit (INDEPENDENT_AMBULATORY_CARE_PROVIDER_SITE_OTHER): Payer: BC Managed Care – PPO

## 2016-07-27 DIAGNOSIS — M778 Other enthesopathies, not elsewhere classified: Secondary | ICD-10-CM

## 2016-07-27 DIAGNOSIS — M7750 Other enthesopathy of unspecified foot: Secondary | ICD-10-CM

## 2016-07-27 DIAGNOSIS — Z9889 Other specified postprocedural states: Secondary | ICD-10-CM | POA: Diagnosis not present

## 2016-07-27 DIAGNOSIS — M205X9 Other deformities of toe(s) (acquired), unspecified foot: Secondary | ICD-10-CM | POA: Diagnosis not present

## 2016-07-27 DIAGNOSIS — M779 Enthesopathy, unspecified: Secondary | ICD-10-CM

## 2016-07-27 MED ORDER — TRIAMCINOLONE ACETONIDE 10 MG/ML IJ SUSP
10.0000 mg | Freq: Once | INTRAMUSCULAR | Status: AC
  Administered 2016-07-27: 10 mg

## 2016-07-27 MED ORDER — TRAMADOL HCL 50 MG PO TABS: 50.0000 mg | ORAL_TABLET | Freq: Three times a day (TID) | ORAL | 0 refills | Status: DC | PRN

## 2016-07-28 NOTE — Progress Notes (Signed)
Subjective:    Patient ID: Breanna Palmer, female   DOB: 56 y.o.   MRN: [id number]   HPI patient states that she's had some swelling of her left foot and also some on her right foot and she was trying to increase her activity but the outside of both feet are bothering her quite a bit with left being worse    ROS      Objective:  Physical Exam Neurovascular status intact negative Homans sign was noted with pain in the extensor complex left lateral and peroneal complex most likely due to gait change with good range of motion first MPJ so far with mild to moderate swelling around the first MPJ occurring    Assessment:     Overall doing well but probably develop some compensatory tendinitis secondary to gait change as she goes through the healing process    Plan:    X-ray reviewed and allow patient to start tramadol and aggressive ice therapy with compression. If not improving we will consider physical therapy or other modalities  X-ray indicates osteotomy is healing well with pins in place and no indications of movement

## 2016-07-29 ENCOUNTER — Encounter: Payer: Self-pay | Admitting: Gynecology

## 2016-08-14 ENCOUNTER — Encounter: Payer: Self-pay | Admitting: Podiatry

## 2016-08-14 ENCOUNTER — Ambulatory Visit (INDEPENDENT_AMBULATORY_CARE_PROVIDER_SITE_OTHER): Payer: BC Managed Care – PPO | Admitting: Podiatry

## 2016-08-14 DIAGNOSIS — M779 Enthesopathy, unspecified: Secondary | ICD-10-CM | POA: Diagnosis not present

## 2016-08-14 MED ORDER — TRIAMCINOLONE ACETONIDE 10 MG/ML IJ SUSP
10.0000 mg | Freq: Once | INTRAMUSCULAR | Status: AC
  Administered 2016-08-14: 10 mg

## 2016-08-14 NOTE — Progress Notes (Signed)
Subjective:    Patient ID: Breanna Palmer, female   DOB: 56 y.o.   MRN: [id number]   HPI patient presents stating I'm getting pain in both the tops of my feet on the outside and I am going on a trip later this year. States that it gets achy and that she is doing well from the surgery but concerned about discomfort    ROS      Objective:  Physical Exam neurovascular status intact with lateral tendinitis bilateral that's improved but present with patient's range of motion first MPJ doing extremely well bilateral     Assessment:    Tendinitis of the lateral tendon complex bilateral with inflammation fluid buildup noted     Plan:    H&P condition reviewed and careful sheath injection administered bilateral 3 mg Kenalog 5 mill grams Xylocaine to reduce the inflammatory complex and advised on supportive shoe and will reappoint to recheck

## 2016-08-20 NOTE — Progress Notes (Signed)
1. Bi=Planar osteotomy with pin left 2. Removal 2 pins from top right 1st metatarsal

## 2016-08-24 ENCOUNTER — Encounter: Payer: Self-pay | Admitting: Gynecology

## 2016-09-15 ENCOUNTER — Inpatient Hospital Stay (HOSPITAL_COMMUNITY)
Admission: AD | Admit: 2016-09-15 | Discharge: 2016-09-15 | Disposition: A | Discharge status: Home / Self Care | Payer: BC Managed Care – PPO | Source: Ambulatory Visit | Attending: Obstetrics and Gynecology | Admitting: Obstetrics and Gynecology

## 2016-09-15 ENCOUNTER — Encounter (HOSPITAL_COMMUNITY): Payer: Self-pay

## 2016-09-15 DIAGNOSIS — N939 Abnormal uterine and vaginal bleeding, unspecified: Secondary | ICD-10-CM | POA: Diagnosis not present

## 2016-09-15 DIAGNOSIS — Z87891 Personal history of nicotine dependence: Secondary | ICD-10-CM | POA: Diagnosis not present

## 2016-09-15 DIAGNOSIS — Z90710 Acquired absence of both cervix and uterus: Secondary | ICD-10-CM | POA: Insufficient documentation

## 2016-09-15 DIAGNOSIS — R310 Gross hematuria: Secondary | ICD-10-CM

## 2016-09-15 DIAGNOSIS — R300 Dysuria: Secondary | ICD-10-CM

## 2016-09-15 HISTORY — DX: Dysuria: R30.0

## 2016-09-15 HISTORY — DX: Gross hematuria: R31.0

## 2016-09-15 LAB — URINALYSIS, ROUTINE W REFLEX MICROSCOPIC

## 2016-09-15 LAB — WET PREP, GENITAL
Clue Cells Wet Prep HPF POC: NONE SEEN
Sperm: NONE SEEN
Trich, Wet Prep: NONE SEEN
YEAST WET PREP: NONE SEEN

## 2016-09-15 LAB — GC/CHLAMYDIA PROBE AMP (~~LOC~~) NOT AT ARMC
Chlamydia: NEGATIVE
Neisseria Gonorrhea: NEGATIVE

## 2016-09-15 LAB — URINALYSIS, MICROSCOPIC (REFLEX)

## 2016-09-15 MED ORDER — PHENAZOPYRIDINE HCL 200 MG PO TABS: 200.0000 mg | ORAL_TABLET | Freq: Three times a day (TID) | ORAL | 0 refills | Status: AC

## 2016-09-15 MED ORDER — NITROFURANTOIN MONOHYD MACRO 100 MG PO CAPS: 100.0000 mg | ORAL_CAPSULE | Freq: Two times a day (BID) | ORAL | 0 refills | Status: AC

## 2016-09-15 MED ORDER — KETOROLAC TROMETHAMINE 60 MG/2ML IM SOLN
60.0000 mg | INTRAMUSCULAR | Status: AC
  Administered 2016-09-15: 60 mg via INTRAMUSCULAR
  Filled 2016-09-15: qty 2

## 2016-09-15 NOTE — MAU Note (Signed)
Pt states that before she went to bed, she had some lower abdominal cramping. States that she woke up around 0300 and had some bleeding with clots. States she was unsure if bleeding was urinary or from vagina. Had a hysterectomy when she was 15

## 2016-09-15 NOTE — MAU Provider Note (Signed)
History     CSN: [id number]  Arrival date and time: 09/15/16 0355   First Provider Initiated Contact with Patient 09/15/16 [phone]      Chief Complaint  Patient presents with  . Vaginal Bleeding   Breanna Palmer is a 56 yo non-pregnant female presenting with complaints of bleeding, clots and lower abdominal pain.  She reports the abdominal pain started first around 2300 last night.  She then woke up at about 0100 and at 0300 to use the BR when she noticed blood and clots.  She came on in to be evaluated, because she has had a hysterectomy.  She is sexually active with her husband of many years.  Last SI was Sunday 09/13/2016; no difficulties.  She reports some dysuria prior to coming tonight, but "it has gotten worse since getting here".  She's never had a UTI, so unsure if this is what she has.  * Patient getting up to BR at least 6 times since arriving to MAU 1.5 hrs ago.  Stating "It (urine) is getting redder and redder with more clots".  Past Medical History:  Diagnosis Date  . Anxiety   . Arthritis   . Depression   . Diabetes mellitus   . Esophageal ulcer   . Fibromyalgia   . Hyperlipidemia   . Menopause    AGE 79  . Missed ab   . NSVD (normal spontaneous vaginal delivery)    X2  . Seasonal allergies   . Sleep apnea    possible sleep apnea, was using a CPAP at one time, but not now    Past Surgical History:  Procedure Laterality Date  . DILATION AND CURETTAGE OF UTERUS    . FOOT SURGERY  2011   right  . FOOT SURGERY Right    spurs  . KNEE ARTHROSCOPY  1998, 2008   right  . VAGINAL HYSTERECTOMY  1997    Family History  Problem Relation Age of Onset  . Colon cancer Paternal Grandmother 71  . Diabetes Mother   . Diabetes Sister   . Cancer Father        prostate  . Breast cancer Paternal Aunt 72  . Esophageal cancer Neg Hx   . Stomach cancer Neg Hx   . Rectal cancer Neg Hx     Social History  Substance Use Topics  . Smoking status: Former Smoker    Quit  date: 10/20/2010  . Smokeless tobacco: Former Systems developer    Quit date: 02/19/1984  . Alcohol use 1.2 oz/week    2 Cans of beer per week     Comment: weekends    Allergies:  Allergies  Allergen Reactions  . Morphine And Related Itching  . Sulfa Antibiotics Rash    Mild; has taken since and been ok with it    Prescriptions Prior to Admission  Medication Sig Dispense Refill Last Dose  . amoxicillin (AMOXIL) 875 MG tablet Take 875 mg by mouth 2 (two) times daily.   Past Week at Unknown time  . buPROPion (WELLBUTRIN SR) 150 MG 12 hr tablet Take 150 mg by mouth 2 (two) times daily.   09/14/2016 at Unknown time  . cholecalciferol (VITAMIN D) 1000 units tablet Take 2,000 Units by mouth daily.   09/14/2016 at Unknown time  . clonazePAM (KLONOPIN) 1 MG tablet Take 1 mg by mouth 2 (two) times daily.   09/14/2016 at Unknown time  . DULoxetine (CYMBALTA) 60 MG capsule Take 60 mg by mouth daily.  09/14/2016 at Unknown time  . METFORMIN HCL PO Take 500 mg by mouth 2 (two) times daily.    09/14/2016 at Unknown time  . METOPROLOL TARTRATE PO Take by mouth daily.     09/14/2016 at Unknown time  . pantoprazole (PROTONIX) 40 MG tablet TAKE 1 TABLET DAILY 90 tablet 3 09/14/2016 at Unknown time  . rosuvastatin (CRESTOR) 40 MG tablet Take 40 mg by mouth daily.   09/14/2016 at Unknown time  . calcium carbonate 200 MG capsule Take by mouth 2 (two) times daily with a meal.     More than a month at Unknown time  . clonazePAM (KLONOPIN) 0.5 MG tablet Take 1 tablet (0.5 mg total) by mouth 2 (two) times daily as needed for anxiety. 30 tablet 3   . Estradiol 10 MCG TABS vaginal tablet Place 1 tablet (10 mcg total) vaginally 2 (two) times a week. 24 tablet 4 Taking  . niacin (NIASPAN) 1000 MG CR tablet at bedtime.   Taking  . ondansetron (ZOFRAN) 4 MG tablet Take 4 mg by mouth every 8 (eight) hours as needed for nausea or vomiting.     Marland Kitchen oxyCODONE-acetaminophen (PERCOCET) 10-325 MG tablet Take 1 tablet by mouth every 4 (four) hours as  needed for pain.     . traMADol (ULTRAM) 50 MG tablet Take 1 tablet (50 mg total) by mouth every 8 (eight) hours as needed. 30 tablet 0 More than a month at Unknown time    Review of Systems  Constitutional: Negative.   HENT: Negative.   Respiratory: Negative.   Cardiovascular: Negative.   Gastrointestinal: Positive for abdominal pain (lower).  Endocrine: Negative.   Genitourinary: Positive for dysuria.       Blood and clots in urine  Musculoskeletal: Negative.   Skin: Negative.   Allergic/Immunologic: Negative.   Neurological: Negative.   Hematological: Negative.   Psychiatric/Behavioral: Negative.    Physical Exam   Blood pressure (!) 153/94, pulse 92, resp. rate 20, height 5\' 7"  (1.702 m), weight 67.6 kg (149 lb), SpO2 100 %.  Physical Exam  Constitutional: She is oriented to person, place, and time. She appears well-developed and well-nourished.  HENT:  Head: Normocephalic and atraumatic.  Eyes: Pupils are equal, round, and reactive to light.  Neck: Normal range of motion.  Cardiovascular: Normal rate, regular rhythm and normal heart sounds.   Respiratory: Effort normal and breath sounds normal.  GI: Soft. Bowel sounds are normal. There is tenderness (lower abdomen, R>L). There is no rebound and no guarding.  Genitourinary: Pelvic exam was performed with patient supine. Right adnexum displays tenderness. Vaginal discharge (scant) found.    Musculoskeletal: Normal range of motion.  Neurological: She is alert and oriented to person, place, and time. She has normal reflexes.  Skin: Skin is warm and dry.  Psychiatric: She has a normal mood and affect. Her behavior is normal. Judgment and thought content normal.    MAU Course  Procedures  MDM CCUA CBC - d/c'd not drawn prior to d/c home CMP - d/c'd not drawn prior to d/c home HIV - d/c'd not drawn prior to d/c home Wet Prep GC/CT UCx - pending Toradol 60 mg IM injection x 1 - improved pain *Consult with Dr.  Talbert Nan - notified of pt labs results and assessment - orders received to d/c home with Rx for Macrobid and Pyridium  Results for orders placed or performed during the hospital encounter of 09/15/16 (from the past 24 hour(s))  Urinalysis, Routine w reflex microscopic  Status: Abnormal   Collection Time: 09/15/16  4:06 AM  Result Value Ref Range   Color, Urine RED (A) YELLOW   APPearance TURBID (A) CLEAR   Specific Gravity, Urine  1.005 - 1.030    TEST NOT REPORTED DUE TO COLOR INTERFERENCE OF URINE PIGMENT   pH  5.0 - 8.0    TEST NOT REPORTED DUE TO COLOR INTERFERENCE OF URINE PIGMENT   Glucose, UA (A) NEGATIVE mg/dL    TEST NOT REPORTED DUE TO COLOR INTERFERENCE OF URINE PIGMENT   Hgb urine dipstick (A) NEGATIVE    TEST NOT REPORTED DUE TO COLOR INTERFERENCE OF URINE PIGMENT   Bilirubin Urine (A) NEGATIVE    TEST NOT REPORTED DUE TO COLOR INTERFERENCE OF URINE PIGMENT   Ketones, ur (A) NEGATIVE mg/dL    TEST NOT REPORTED DUE TO COLOR INTERFERENCE OF URINE PIGMENT   Protein, ur (A) NEGATIVE mg/dL    TEST NOT REPORTED DUE TO COLOR INTERFERENCE OF URINE PIGMENT   Nitrite (A) NEGATIVE    TEST NOT REPORTED DUE TO COLOR INTERFERENCE OF URINE PIGMENT   Leukocytes, UA (A) NEGATIVE    TEST NOT REPORTED DUE TO COLOR INTERFERENCE OF URINE PIGMENT  Urinalysis, Microscopic (reflex)     Status: Abnormal   Collection Time: 09/15/16  4:06 AM  Result Value Ref Range   RBC / HPF TOO NUMEROUS TO COUNT 0 - 5 RBC/hpf   WBC, UA 0-5 0 - 5 WBC/hpf   Bacteria, UA RARE (A) NONE SEEN   Squamous Epithelial / LPF 0-5 (A) NONE SEEN  Wet prep, genital     Status: Abnormal   Collection Time: 09/15/16  4:44 AM  Result Value Ref Range   Yeast Wet Prep HPF POC NONE SEEN NONE SEEN   Trich, Wet Prep NONE SEEN NONE SEEN   Clue Cells Wet Prep HPF POC NONE SEEN NONE SEEN   WBC, Wet Prep HPF POC MANY (A) NONE SEEN   Sperm NONE SEEN    Assessment and Plan  Dysuria - Rx for Pyridium 200 mg po TID WC x 2 days -  Rx Macrobid 100 po BID x 7 days - F/U with Dr. Toney Rakes in 1-2 wks  Hematuria, gross -Stay well-hydrated by drinking at least 8-10 glasses of water daily  Discharge home Patient verbalized an understanding of the plan of care and agrees.   Laury Deep, MSN, CNM 09/15/2016, 4:50 AM

## 2016-09-15 NOTE — Discharge Instructions (Signed)
Dysuria Dysuria is pain or discomfort while urinating. The pain or discomfort may be felt in the tube that carries urine out of the bladder (urethra) or in the surrounding tissue of the genitals. The pain may also be felt in the groin area, lower abdomen, and lower back. You may have to urinate frequently or have the sudden feeling that you have to urinate (urgency). Dysuria can affect both men and women, but is more common in women. Dysuria can be caused by many different things, including:  Urinary tract infection in women.  Infection of the kidney or bladder.  Kidney stones or bladder stones.  Certain sexually transmitted infections (STIs), such as chlamydia.  Dehydration.  Inflammation of the vagina.  Use of certain medicines.  Use of certain soaps or scented products that cause irritation.  Follow these instructions at home: Watch your dysuria for any changes. The following actions may help to reduce any discomfort you are feeling:  Drink enough fluid to keep your urine clear or pale yellow.  Empty your bladder often. Avoid holding urine for long periods of time.  After a bowel movement or urination, women should cleanse from front to back, using each tissue only once.  Empty your bladder after sexual intercourse.  Take medicines only as directed by your health care provider.  If you were prescribed an antibiotic medicine, finish it all even if you start to feel better.  Avoid caffeine, tea, and alcohol. They can irritate the bladder and make dysuria worse. In men, alcohol may irritate the prostate.  Keep all follow-up visits as directed by your health care provider. This is important.  If you had any tests done to find the cause of dysuria, it is your responsibility to obtain your test results. Ask the lab or department performing the test when and how you will get your results. Talk with your health care provider if you have any questions about your results.  Contact a  health care provider if:  You develop pain in your back or sides.  You have a fever.  You have nausea or vomiting.  You have blood in your urine.  You are not urinating as often as you usually do. Get help right away if:  You pain is severe and not relieved with medicines.  You are unable to hold down any fluids.  You or someone else notices a change in your mental function.  You have a rapid heartbeat at rest.  You have shaking or chills.  You feel extremely weak. This information is not intended to replace advice given to you by your health care provider. Make sure you discuss any questions you have with your health care provider. Document Released: 11/29/2003 Document Revised: 08/08/2015 Document Reviewed: 10/26/2013 Elsevier Interactive Patient Education  2018 [employer].   Hematuria, Adult Hematuria is blood in your urine. It can be caused by a bladder infection, kidney infection, prostate infection, kidney stone, or cancer of your urinary tract. Infections can usually be treated with medicine, and a kidney stone usually will pass through your urine. If neither of these is the cause of your hematuria, further workup to find out the reason may be needed. It is very important that you tell your health care provider about any blood you see in your urine, even if the blood stops without treatment or happens without causing pain. Blood in your urine that happens and then stops and then happens again can be a symptom of a very serious condition. Also,  pain is not a symptom in the initial stages of many urinary cancers. Follow these instructions at home:  Drink lots of fluid, 3-4 quarts a day. If you have been diagnosed with an infection, cranberry juice is especially recommended, in addition to large amounts of water.  Avoid caffeine, tea, and carbonated beverages because they tend to irritate the bladder.  Avoid alcohol because it may irritate the prostate.  Take all  medicines as directed by your health care provider.  If you were prescribed an antibiotic medicine, finish it all even if you start to feel better.  If you have been diagnosed with a kidney stone, follow your health care provider's instructions regarding straining your urine to catch the stone.  Empty your bladder often. Avoid holding urine for long periods of time.  After a bowel movement, women should cleanse front to back. Use each tissue only once.  Empty your bladder before and after sexual intercourse if you are a female. Contact a health care provider if:  You develop back pain.  You have a fever.  You have a feeling of sickness in your stomach (nausea) or vomiting.  Your symptoms are not better in 3 days. Return sooner if you are getting worse. Get help right away if:  You develop severe vomiting and are unable to keep the medicine down.  You develop severe back or abdominal pain despite taking your medicines.  You begin passing a large amount of blood or clots in your urine.  You feel extremely weak or faint, or you pass out. This information is not intended to replace advice given to you by your health care provider. Make sure you discuss any questions you have with your health care provider. Document Released: 03/02/2005 Document Revised: 08/08/2015 Document Reviewed: 10/31/2012 Elsevier Interactive Patient Education  2017 [employer].

## 2016-09-17 LAB — URINE CULTURE
Culture: 30000 — AB
Special Requests: NORMAL

## 2016-09-21 ENCOUNTER — Ambulatory Visit (INDEPENDENT_AMBULATORY_CARE_PROVIDER_SITE_OTHER): Payer: BC Managed Care – PPO | Admitting: Gynecology

## 2016-09-21 ENCOUNTER — Encounter: Payer: Self-pay | Admitting: Gynecology

## 2016-09-21 VITALS — BP 132/80 | Ht 67.0 in | Wt 148.0 lb

## 2016-09-21 DIAGNOSIS — Z01419 Encounter for gynecological examination (general) (routine) without abnormal findings: Secondary | ICD-10-CM

## 2016-09-21 DIAGNOSIS — Z87440 Personal history of urinary (tract) infections: Secondary | ICD-10-CM | POA: Diagnosis not present

## 2016-09-21 LAB — URINALYSIS W MICROSCOPIC + REFLEX CULTURE
BILIRUBIN URINE: NEGATIVE
Bacteria, UA: NONE SEEN [HPF]
CASTS: NONE SEEN [LPF]
CRYSTALS: NONE SEEN [HPF]
Glucose, UA: NEGATIVE
Hgb urine dipstick: NEGATIVE
Leukocytes, UA: NEGATIVE
NITRITE: NEGATIVE
Protein, ur: NEGATIVE
RBC / HPF: NONE SEEN RBC/HPF (ref <=2)
WBC UA: NONE SEEN WBC/HPF (ref <=5)
Yeast: NONE SEEN [HPF]
pH: 5.5 (ref 5.0–8.0)

## 2016-09-21 NOTE — Progress Notes (Signed)
Breanna Palmer March 30, 1960 [id number]   History:    56 y.o.  for annual gyn exam with no complaints today. Patient recently was treated in the ED recently as a result of cystitis and has one more day of antibiotic today. She 1 to check her urine today. Her PCP is been doing her blood work. Patient 1997 had a transvaginal hysterectomy without BSO. Patient had a normal bone density study in 2017. Her colonoscopy was normal in 2012. Both parents with history of colon polyps. She is on a 10 year recall.  Past medical history,surgical history, family history and social history were all reviewed and documented in the EPIC chart.  Gynecologic History No LMP recorded. Patient has had a hysterectomy. Contraception: post menopausal status Last Pap: 2016. Results were: normal Last mammogram: 2018. Results were: normal  Obstetric History OB History  Gravida Para Term Preterm AB Living  3 2 2   1 2   SAB TAB Ectopic Multiple Live Births  1       2    # Outcome Date GA Lbr Len/2nd Weight Sex Delivery Anes PTL Lv  3 SAB           2 Term     F Vag-Spont  N LIV  1 Term     M Vag-Spont  N LIV       ROS: A ROS was performed and pertinent positives and negatives are included in the history.  GENERAL: No fevers or chills. HEENT: No change in vision, no earache, sore throat or sinus congestion. NECK: No pain or stiffness. CARDIOVASCULAR: No chest pain or pressure. No palpitations. PULMONARY: No shortness of breath, cough or wheeze. GASTROINTESTINAL: No abdominal pain, nausea, vomiting or diarrhea, melena or bright red blood per rectum. GENITOURINARY: No urinary frequency, urgency, hesitancy or dysuria. MUSCULOSKELETAL: No joint or muscle pain, no back pain, no recent trauma. DERMATOLOGIC: No rash, no itching, no lesions. ENDOCRINE: No polyuria, polydipsia, no heat or cold intolerance. No recent change in weight. HEMATOLOGICAL: No anemia or easy bruising or bleeding. NEUROLOGIC: No headache, seizures,  numbness, tingling or weakness. PSYCHIATRIC: No depression, no loss of interest in normal activity or change in sleep pattern.     Exam: chaperone present  BP 132/80   Ht 5\' 7"  (1.702 m)   Wt 148 lb (67.1 kg)   BMI 23.18 kg/m   Body mass index is 23.18 kg/m.  General appearance : Well developed well nourished female. No acute distress HEENT: Eyes: no retinal hemorrhage or exudates,  Neck supple, trachea midline, no carotid bruits, no thyroidmegaly Lungs: Clear to auscultation, no rhonchi or wheezes, or rib retractions  Heart: Regular rate and rhythm, no murmurs or gallops Breast:Examined in sitting and supine position were symmetrical in appearance, no palpable masses or tenderness,  no skin retraction, no nipple inversion, no nipple discharge, no skin discoloration, no axillary or supraclavicular lymphadenopathy Abdomen: no palpable masses or tenderness, no rebound or guarding Extremities: no edema or skin discoloration or tenderness  Pelvic:  Bartholin, Urethra, Skene Glands: Within normal limits             Vagina: No gross lesions or discharge  Cervix: Absent  Uterus  absent  Adnexa  Without masses or tenderness  Anus and perineum  normal   Rectovaginal  normal sphincter tone without palpated masses or tenderness             Hemoccult PCP provides   Urinalysis today trace ketone otherwise negative  Assessment/Plan:  56 y.o. female for annual exam asymptomatic was reminded of the importance of calcium vitamin D and weightbearing exercises for osteoporosis prevention. PCP is tender her blood work. Patient no longer needs Pap smears. She needs a bone density study next year.   Uvaldo Rising H MD, 12:00 PM 09/21/2016

## 2016-10-30 ENCOUNTER — Ambulatory Visit: Payer: BC Managed Care – PPO | Admitting: Podiatry

## 2016-11-09 ENCOUNTER — Encounter: Payer: Self-pay | Admitting: Podiatry

## 2016-11-09 ENCOUNTER — Ambulatory Visit (INDEPENDENT_AMBULATORY_CARE_PROVIDER_SITE_OTHER): Payer: BC Managed Care – PPO

## 2016-11-09 ENCOUNTER — Ambulatory Visit (INDEPENDENT_AMBULATORY_CARE_PROVIDER_SITE_OTHER): Payer: BC Managed Care – PPO | Admitting: Podiatry

## 2016-11-09 ENCOUNTER — Ambulatory Visit: Payer: BC Managed Care – PPO

## 2016-11-09 DIAGNOSIS — Z9889 Other specified postprocedural states: Secondary | ICD-10-CM

## 2016-11-09 DIAGNOSIS — M205X9 Other deformities of toe(s) (acquired), unspecified foot: Secondary | ICD-10-CM

## 2016-11-09 DIAGNOSIS — M779 Enthesopathy, unspecified: Secondary | ICD-10-CM

## 2016-11-09 MED ORDER — TRIAMCINOLONE ACETONIDE 10 MG/ML IJ SUSP
10.0000 mg | Freq: Once | INTRAMUSCULAR | Status: AC
  Administered 2016-11-09: 10 mg

## 2016-11-09 NOTE — Progress Notes (Signed)
Subjective:    Patient ID: Breanna Palmer, female   DOB: 56 y.o.   MRN: [id number]   HPI patient states that I'm doing good with my big toe joints but I still get pain in the lateral side of both feet and I may need to have a change and my orthotics    ROS      Objective:  Physical Exam neurovascular status intact with patient's first MPJ bilateral healing very well with excellent range of motion no restriction no crepitus noted and discomfort in the lateral side of both feet around the peroneal tertius group and into the distal lateral extensor group     Assessment:    Tendinitis bilateral with well-healing surgical sites first MPJ with good range of motion     Plan:    H&P conditions reviewed and recommended careful injections after review x-rays of the lateral sheath which was accomplished. She will bring orthotics and I may consider other modifications are new orthotics depending on how they look. Reappoint one month  X-rays indicate there is moderate narrowness of the first MPJ bilateral but functionally they're doing very well pins are in place left with pins right that had been removed

## 2016-12-07 ENCOUNTER — Ambulatory Visit: Payer: BC Managed Care – PPO | Admitting: Podiatry

## 2017-01-18 ENCOUNTER — Other Ambulatory Visit (HOSPITAL_BASED_OUTPATIENT_CLINIC_OR_DEPARTMENT_OTHER): Payer: Self-pay

## 2017-01-18 DIAGNOSIS — G4730 Sleep apnea, unspecified: Secondary | ICD-10-CM

## 2017-01-18 DIAGNOSIS — G4710 Hypersomnia, unspecified: Secondary | ICD-10-CM

## 2017-01-18 DIAGNOSIS — R0683 Snoring: Secondary | ICD-10-CM

## 2017-02-01 ENCOUNTER — Ambulatory Visit (HOSPITAL_BASED_OUTPATIENT_CLINIC_OR_DEPARTMENT_OTHER): Payer: BC Managed Care – PPO | Attending: Internal Medicine | Admitting: Internal Medicine

## 2017-02-01 VITALS — Ht 66.0 in | Wt 145.0 lb

## 2017-02-01 DIAGNOSIS — R0683 Snoring: Secondary | ICD-10-CM | POA: Diagnosis not present

## 2017-02-01 DIAGNOSIS — R0681 Apnea, not elsewhere classified: Secondary | ICD-10-CM | POA: Insufficient documentation

## 2017-02-01 DIAGNOSIS — G4730 Sleep apnea, unspecified: Secondary | ICD-10-CM

## 2017-02-01 DIAGNOSIS — G4710 Hypersomnia, unspecified: Secondary | ICD-10-CM | POA: Insufficient documentation

## 2017-02-03 NOTE — Procedures (Signed)
   NAME: Breanna Palmer DATE OF BIRTH:  22-Dec-1960 MEDICAL RECORD NUMBER [id number]  LOCATION: [facility]  PHYSICIAN: Marius Ditch  DATE OF STUDY: 02/01/2017  SLEEP STUDY TYPE: Nocturnal Polysomnogram               REFERRING PHYSICIAN: Marius Ditch, MD  INDICATION FOR STUDY: history of OSA in the past. Moderate OSA noted on screening home sleep test done by orthodontist  EPWORTH SLEEPINESS SCORE:  3 HEIGHT: 5\' 6"  (167.6 cm)  WEIGHT: 145 lb (65.8 kg)    Body mass index is 23.4 kg/m.  NECK SIZE: 13 in.  MEDICATIONS Patient self administered medications include: BUPROPION, METFORMIN, ROSUVASTATIN. Medications administered during study include No sleep medicine administered.Marland Kitchen  SLEEP STUDY TECHNIQUE A multi-channel overnight Polysomnography study was performed. The channels recorded and monitored were central and occipital EEG, electrooculogram (EOG), submentalis EMG (chin), nasal and oral airflow, thoracic and abdominal wall motion, anterior tibialis EMG, snore microphone, electrocardiogram, and a pulse oximetry.  TECHNICAL COMMENTS Comments added by Technician: BATHROOM BREAKS X1. PATIENT REPORTED TAKING NIGHT MEDICATIONS AT 2130  Comments added by Scorer: N/A  SLEEP ARCHITECTURE The study was initiated at 10:39:22 PM and terminated at 4:48:05 AM. The total recorded time was 368.7 minutes. EEG confirmed total sleep time was 317.0 minutes yielding a sleep efficiency of 86.0%. Sleep onset after lights out was 25.4 minutes with a REM latency of 158.5 minutes. The patient spent 8.04% of the night in stage N1 sleep, 68.77% in stage N2 sleep, 1.74% in stage N3 and 21.45% in REM. Wake after sleep onset (WASO) was 26.4 minutes. The Arousal Index was 6.2/hour.  RESPIRATORY PARAMETERS There were a total of 24 respiratory disturbances out of which 5 were apneas ( 3 obstructive, 1 mixed, 1 central) and 19 hypopneas. The apnea/hypopnea index (AHI) was 4.5 events/hour.  The central sleep apnea index was 0.2 events/hour. The REM AHI was 0.0 events/hour and NREM AHI was 5.8 events/hour. The supine AHI was N/A events/hour and the non supine AHI was 4.54 supine during 0.00% of sleep. Respiratory disturbances were associated with oxygen desaturation down to a nadir of 88.00% during sleep. The mean oxygen saturation during the study was 94.45%. The cumulative time under 88% oxygen saturation was 0.1 minutes.  LEG MOVEMENT DATA The total leg movements were with a resulting leg movement index of 23/hr . Associated arousal with leg movement index was 0.9/hr.  CARDIAC DATA The underlying cardiac rhythm was most consistent with sinus rhythm. Mean heart rate during sleep was 81.62 bpm. Additional rhythm abnormalities include None.  IMPRESSIONS - No significant Obstructive Sleep apnea(OSA)  DIAGNOSIS - Normal study  RECOMMENDATIONS - No indication for CPAP or other intervention based on this study.    Marius Ditch Sleep specialist, American Board of Sleep Medicine  ELECTRONICALLY SIGNED ON:  02/03/2017, 9:19 PM [place] PH: [phone]   FX: [phone] [place]

## 2017-02-16 ENCOUNTER — Ambulatory Visit: Payer: BC Managed Care – PPO | Admitting: Podiatry

## 2017-02-16 ENCOUNTER — Encounter: Payer: Self-pay | Admitting: Podiatry

## 2017-02-16 ENCOUNTER — Ambulatory Visit (INDEPENDENT_AMBULATORY_CARE_PROVIDER_SITE_OTHER): Payer: BC Managed Care – PPO

## 2017-02-16 DIAGNOSIS — M778 Other enthesopathies, not elsewhere classified: Secondary | ICD-10-CM

## 2017-02-16 DIAGNOSIS — M779 Enthesopathy, unspecified: Secondary | ICD-10-CM

## 2017-02-16 DIAGNOSIS — M7752 Other enthesopathy of left foot: Secondary | ICD-10-CM | POA: Diagnosis not present

## 2017-02-16 DIAGNOSIS — Z9889 Other specified postprocedural states: Secondary | ICD-10-CM

## 2017-02-16 DIAGNOSIS — M7751 Other enthesopathy of right foot: Secondary | ICD-10-CM

## 2017-02-18 NOTE — Progress Notes (Signed)
   HPI: 56 year old female presenting today with a complaint of pain to the lateral side of bilateral feet, right worse than left, since having surgery in March 2018.  She describes the pain as a constant dull ache and rates it at 4/10.  She has been icing the area with some relief of the pain.  She has been soaking the feet in Epsom salt with no significant relief.  Patient is here for further evaluation and treatment.   Past Medical History:  Diagnosis Date  . Anxiety   . Arthritis   . Depression   . Diabetes mellitus   . Dysuria 09/15/2016  . Esophageal ulcer   . Fibromyalgia   . Hematuria, gross 09/15/2016  . Hyperlipidemia   . Menopause    AGE 70  . Missed ab   . NSVD (normal spontaneous vaginal delivery)    X2  . Seasonal allergies   . Sleep apnea    possible sleep apnea, was using a CPAP at one time, but not now     Physical Exam: General: The patient is alert and oriented x3 in no acute distress.  Dermatology: Skin is warm, dry and supple bilateral lower extremities. Negative for open lesions or macerations.  Vascular: Palpable pedal pulses bilaterally. No edema or erythema noted. Capillary refill within normal limits.  Neurological: Epicritic and protective threshold grossly intact bilaterally.   Musculoskeletal Exam: Pain with palpation to the lateral midfoot of bilateral feet. Range of motion within normal limits to all pedal and ankle joints bilateral. Muscle strength 5/5 in all groups bilateral.   Radiographic Exam:  Normal osseous mineralization. Joint spaces preserved. No fracture/dislocation/boney destruction.    Assessment: -Lateral capsulitis of the bilateral midfoot   Plan of Care:  -Patient evaluated.  X-rays reviewed. - Injection of 0.5 mLs Celestone Soluspan injected into the lateral midfoot bilaterally. - Molded today by Liliane Channel for custom orthotics. - Return to clinic as needed.   Edrick Kins, DPM [facility]  Dr. Edrick Kins,  DPM    [address], [place] [address]                Office [phone]  Fax [phone]

## 2017-03-01 ENCOUNTER — Encounter (HOSPITAL_BASED_OUTPATIENT_CLINIC_OR_DEPARTMENT_OTHER): Payer: BC Managed Care – PPO

## 2017-03-07 ENCOUNTER — Other Ambulatory Visit: Payer: Self-pay | Admitting: Internal Medicine

## 2017-03-17 ENCOUNTER — Ambulatory Visit: Payer: BC Managed Care – PPO | Admitting: Orthotics

## 2017-03-17 DIAGNOSIS — M779 Enthesopathy, unspecified: Principal | ICD-10-CM

## 2017-03-17 DIAGNOSIS — M205X9 Other deformities of toe(s) (acquired), unspecified foot: Secondary | ICD-10-CM

## 2017-03-17 DIAGNOSIS — M778 Other enthesopathies, not elsewhere classified: Secondary | ICD-10-CM

## 2017-03-17 NOTE — Progress Notes (Signed)
Patient came in today to pick up custom made foot orthotics.  The goals were accomplished and the patient reported no dissatisfaction with said orthotics.  Patient was advised of breakin period and how to report any issues. 

## 2017-09-20 ENCOUNTER — Encounter: Payer: Self-pay | Admitting: Women's Health

## 2017-10-04 ENCOUNTER — Encounter: Payer: BC Managed Care – PPO | Admitting: Women's Health

## 2017-12-01 ENCOUNTER — Encounter: Payer: BC Managed Care – PPO | Admitting: Women's Health

## 2017-12-14 ENCOUNTER — Encounter: Payer: Self-pay | Admitting: Women's Health

## 2017-12-14 ENCOUNTER — Ambulatory Visit: Payer: BC Managed Care – PPO | Admitting: Women's Health

## 2017-12-14 VITALS — BP 134/80 | Ht 65.0 in | Wt 158.0 lb

## 2017-12-14 DIAGNOSIS — Z23 Encounter for immunization: Secondary | ICD-10-CM

## 2017-12-14 DIAGNOSIS — Z01419 Encounter for gynecological examination (general) (routine) without abnormal findings: Secondary | ICD-10-CM | POA: Diagnosis not present

## 2017-12-14 NOTE — Progress Notes (Signed)
Breanna Palmer [birth year] [id number]    History:    Presents for annual exam.  1997 TVH on no HRT.  Normal Pap and mammogram history.  Hypertension, hypercholesteremia, diabetes diet controlled, anxiety/depression primary care manages labs and meds.  2017 normal DEXA.  2012- colonoscopy..  Past medical history, past surgical history, family history and social history were all reviewed and documented in the EPIC chart.  Retired Radio producer.  Mother diabetes, father prostate cancer.  2 children both doing well.  ROS:  A ROS was performed and pertinent positives and negatives are included.  Exam:  Vitals:   12/14/17 1404  BP: 134/80  Weight: 158 lb (71.7 kg)  Height: 5\' 5"  (1.651 m)   Body mass index is 26.29 kg/m.   General appearance:  Normal Thyroid:  Symmetrical, normal in size, without palpable masses or nodularity. Respiratory  Auscultation:  Clear without wheezing or rhonchi Cardiovascular  Auscultation:  Regular rate, without rubs, murmurs or gallops  Edema/varicosities:  Not grossly evident Abdominal  Soft,nontender, without masses, guarding or rebound.  Liver/spleen:  No organomegaly noted  Hernia:  None appreciated  Skin  Inspection:  Grossly normal   Breasts: Examined lying and sitting.     Right: Without masses, retractions, discharge or axillary adenopathy.     Left: Without masses, retractions, discharge or axillary adenopathy. Gentitourinary   Inguinal/mons:  Normal without inguinal adenopathy  External genitalia:  Normal  BUS/Urethra/Skene's glands:  Normal  Vagina:  Normal  Cervix: And uterus absent adnexa/parametria:     Rt: Without masses or tenderness.   Lt: Without masses or tenderness.  Anus and perineum: Normal  Digital rectal exam: Normal sphincter tone without palpated masses or tenderness  Assessment/Plan:  57 y.o. MWF G3 P2 for annual exam no complaints.  97 TVH on no HRT Hypercholesteremia, anxiety/depression, hypertension-primary care  manages labs and meds  Plan: History of pneumonia, Pneumovax reviewed will discuss with primary care.  SBE's, continue annual 3D screening mammogram, calcium rich foods, vitamin D 2000 daily encouraged.  Normal DEXA 2017 we will repeat in 2 years, home safety, fall prevention and importance of weightbearing and balance type exercise reviewed and encouraged.  Pap screening guidelines reviewed.Huel Cote [facility], 2:51 PM 12/14/2017

## 2017-12-14 NOTE — Patient Instructions (Signed)
Health Maintenance for Postmenopausal Women Menopause is a normal process in which your reproductive ability comes to an end. This process happens gradually over a span of months to years, usually between the ages of 22 and 9. Menopause is complete when you have missed 12 consecutive menstrual periods. It is important to talk with your health care provider about some of the most common conditions that affect postmenopausal women, such as heart disease, cancer, and bone loss (osteoporosis). Adopting a healthy lifestyle and getting preventive care can help to promote your health and wellness. Those actions can also lower your chances of developing some of these common conditions. What should I know about menopause? During menopause, you may experience a number of symptoms, such as:  Moderate-to-severe hot flashes.  Night sweats.  Decrease in sex drive.  Mood swings.  Headaches.  Tiredness.  Irritability.  Memory problems.  Insomnia.  Choosing to treat or not to treat menopausal changes is an individual decision that you make with your health care provider. What should I know about hormone replacement therapy and supplements? Hormone therapy products are effective for treating symptoms that are associated with menopause, such as hot flashes and night sweats. Hormone replacement carries certain risks, especially as you become older. If you are thinking about using estrogen or estrogen with progestin treatments, discuss the benefits and risks with your health care provider. What should I know about heart disease and stroke? Heart disease, heart attack, and stroke become more likely as you age. This may be due, in part, to the hormonal changes that your body experiences during menopause. These can affect how your body processes dietary fats, triglycerides, and cholesterol. Heart attack and stroke are both medical emergencies. There are many things that you can do to help prevent heart disease  and stroke:  Have your blood pressure checked at least every 1-2 years. High blood pressure causes heart disease and increases the risk of stroke.  If you are 53-22 years old, ask your health care provider if you should take aspirin to prevent a heart attack or a stroke.  Do not use any tobacco products, including cigarettes, chewing tobacco, or electronic cigarettes. If you need help quitting, ask your health care provider.  It is important to eat a healthy diet and maintain a healthy weight. ? Be sure to include plenty of vegetables, fruits, low-fat dairy products, and lean protein. ? Avoid eating foods that are high in solid fats, added sugars, or salt (sodium).  Get regular exercise. This is one of the most important things that you can do for your health. ? Try to exercise for at least 150 minutes each week. The type of exercise that you do should increase your heart rate and make you sweat. This is known as moderate-intensity exercise. ? Try to do strengthening exercises at least twice each week. Do these in addition to the moderate-intensity exercise.  Know your numbers.Ask your health care provider to check your cholesterol and your blood glucose. Continue to have your blood tested as directed by your health care provider.  What should I know about cancer screening? There are several types of cancer. Take the following steps to reduce your risk and to catch any cancer development as early as possible. Breast Cancer  Practice breast self-awareness. ? This means understanding how your breasts normally appear and feel. ? It also means doing regular breast self-exams. Let your health care provider know about any changes, no matter how small.  If you are 40  or older, have a clinician do a breast exam (clinical breast exam or CBE) every year. Depending on your age, family history, and medical history, it may be recommended that you also have a yearly breast X-ray (mammogram).  If you  have a family history of breast cancer, talk with your health care provider about genetic screening.  If you are at high risk for breast cancer, talk with your health care provider about having an MRI and a mammogram every year.  Breast cancer (BRCA) gene test is recommended for women who have family members with BRCA-related cancers. Results of the assessment will determine the need for genetic counseling and BRCA1 and for BRCA2 testing. BRCA-related cancers include these types: ? Breast. This occurs in males or females. ? Ovarian. ? Tubal. This may also be called fallopian tube cancer. ? Cancer of the abdominal or pelvic lining (peritoneal cancer). ? Prostate. ? Pancreatic.  Cervical, Uterine, and Ovarian Cancer Your health care provider may recommend that you be screened regularly for cancer of the pelvic organs. These include your ovaries, uterus, and vagina. This screening involves a pelvic exam, which includes checking for microscopic changes to the surface of your cervix (Pap test).  For women ages 21-65, health care providers may recommend a pelvic exam and a Pap test every three years. For women ages 79-65, they may recommend the Pap test and pelvic exam, combined with testing for human papilloma virus (HPV), every five years. Some types of HPV increase your risk of cervical cancer. Testing for HPV may also be done on women of any age who have unclear Pap test results.  Other health care providers may not recommend any screening for nonpregnant women who are considered low risk for pelvic cancer and have no symptoms. Ask your health care provider if a screening pelvic exam is right for you.  If you have had past treatment for cervical cancer or a condition that could lead to cancer, you need Pap tests and screening for cancer for at least 20 years after your treatment. If Pap tests have been discontinued for you, your risk factors (such as having a new sexual partner) need to be  reassessed to determine if you should start having screenings again. Some women have medical problems that increase the chance of getting cervical cancer. In these cases, your health care provider may recommend that you have screening and Pap tests more often.  If you have a family history of uterine cancer or ovarian cancer, talk with your health care provider about genetic screening.  If you have vaginal bleeding after reaching menopause, tell your health care provider.  There are currently no reliable tests available to screen for ovarian cancer.  Lung Cancer Lung cancer screening is recommended for adults 69-62 years old who are at high risk for lung cancer because of a history of smoking. A yearly low-dose CT scan of the lungs is recommended if you:  Currently smoke.  Have a history of at least 30 pack-years of smoking and you currently smoke or have quit within the past 15 years. A pack-year is smoking an average of one pack of cigarettes per day for one year.  Yearly screening should:  Continue until it has been 15 years since you quit.  Stop if you develop a health problem that would prevent you from having lung cancer treatment.  Colorectal Cancer  This type of cancer can be detected and can often be prevented.  Routine colorectal cancer screening usually begins at  age 42 and continues through age 45.  If you have risk factors for colon cancer, your health care provider may recommend that you be screened at an earlier age.  If you have a family history of colorectal cancer, talk with your health care provider about genetic screening.  Your health care provider may also recommend using home test kits to check for hidden blood in your stool.  A small camera at the end of a tube can be used to examine your colon directly (sigmoidoscopy or colonoscopy). This is done to check for the earliest forms of colorectal cancer.  Direct examination of the colon should be repeated every  5-10 years until age 71. However, if early forms of precancerous polyps or small growths are found or if you have a family history or genetic risk for colorectal cancer, you may need to be screened more often.  Skin Cancer  Check your skin from head to toe regularly.  Monitor any moles. Be sure to tell your health care provider: ? About any new moles or changes in moles, especially if there is a change in a mole's shape or color. ? If you have a mole that is larger than the size of a pencil eraser.  If any of your family members has a history of skin cancer, especially at a young age, talk with your health care provider about genetic screening.  Always use sunscreen. Apply sunscreen liberally and repeatedly throughout the day.  Whenever you are outside, protect yourself by wearing long sleeves, pants, a wide-brimmed hat, and sunglasses.  What should I know about osteoporosis? Osteoporosis is a condition in which bone destruction happens more quickly than new bone creation. After menopause, you may be at an increased risk for osteoporosis. To help prevent osteoporosis or the bone fractures that can happen because of osteoporosis, the following is recommended:  If you are 46-71 years old, get at least 1,000 mg of calcium and at least 600 mg of vitamin D per day.  If you are older than age 55 but younger than age 65, get at least 1,200 mg of calcium and at least 600 mg of vitamin D per day.  If you are older than age 54, get at least 1,200 mg of calcium and at least 800 mg of vitamin D per day.  Smoking and excessive alcohol intake increase the risk of osteoporosis. Eat foods that are rich in calcium and vitamin D, and do weight-bearing exercises several times each week as directed by your health care provider. What should I know about how menopause affects my mental health? Depression may occur at any age, but it is more common as you become older. Common symptoms of depression  include:  Low or sad mood.  Changes in sleep patterns.  Changes in appetite or eating patterns.  Feeling an overall lack of motivation or enjoyment of activities that you previously enjoyed.  Frequent crying spells.  Talk with your health care provider if you think that you are experiencing depression. What should I know about immunizations? It is important that you get and maintain your immunizations. These include:  Tetanus, diphtheria, and pertussis (Tdap) booster vaccine.  Influenza every year before the flu season begins.  Pneumonia vaccine.  Shingles vaccine.  Your health care provider may also recommend other immunizations. This information is not intended to replace advice given to you by your health care provider. Make sure you discuss any questions you have with your health care provider. Document Released: 04/24/2005  Document Revised: 09/20/2015 Document Reviewed: 12/04/2014 Elsevier Interactive Patient Education  2018 [employer].  

## 2018-10-06 ENCOUNTER — Encounter: Payer: Self-pay | Admitting: Women's Health

## 2018-10-24 ENCOUNTER — Other Ambulatory Visit: Payer: Self-pay | Admitting: Podiatry

## 2018-10-24 ENCOUNTER — Encounter: Payer: Self-pay | Admitting: Podiatry

## 2018-10-24 ENCOUNTER — Ambulatory Visit (INDEPENDENT_AMBULATORY_CARE_PROVIDER_SITE_OTHER): Payer: BC Managed Care – PPO | Admitting: Podiatry

## 2018-10-24 ENCOUNTER — Ambulatory Visit (INDEPENDENT_AMBULATORY_CARE_PROVIDER_SITE_OTHER): Payer: BC Managed Care – PPO

## 2018-10-24 ENCOUNTER — Other Ambulatory Visit: Payer: Self-pay

## 2018-10-24 VITALS — Temp 97.1°F

## 2018-10-24 DIAGNOSIS — S99922A Unspecified injury of left foot, initial encounter: Secondary | ICD-10-CM

## 2018-10-24 DIAGNOSIS — M778 Other enthesopathies, not elsewhere classified: Secondary | ICD-10-CM

## 2018-10-26 NOTE — Progress Notes (Signed)
   HPI: 58 y.o. female presenting today with a chief complaint of pain and swelling to the left 2nd toe that began yesterday secondary to tripping over something. She reports associated bruising of the area. She reports having surgery on the toe in the past. Walking and wearing shoes increases the pain. She has been taking Motrin for treatment. Patient is here for further evaluation and treatment.   Past Medical History:  Diagnosis Date  . Anxiety   . Arthritis   . Depression   . Diabetes mellitus   . Dysuria 09/15/2016  . Esophageal ulcer   . Fibromyalgia   . Hematuria, gross 09/15/2016  . Hyperlipidemia   . Menopause    AGE 38  . Missed ab   . NSVD (normal spontaneous vaginal delivery)    X2  . Seasonal allergies   . Sleep apnea    possible sleep apnea, was using a CPAP at one time, but not now     Physical Exam: General: The patient is alert and oriented x3 in no acute distress.  Dermatology: Skin is warm, dry and supple bilateral lower extremities. Negative for open lesions or macerations.  Vascular: Palpable pedal pulses bilaterally. No erythema noted. Capillary refill within normal limits.  Neurological: Epicritic and protective threshold grossly intact bilaterally.   Musculoskeletal Exam: Ecchymosis and edema noted to the left 2nd toe. Range of motion within normal limits to all pedal and ankle joints bilateral. Muscle strength 5/5 in all groups bilateral.   Radiographic Exam:  Orthopedic hardware intact. Complete arthrodesis of PIPJ noted to the 2nd digit.     Assessment: 1. Soft tissue injury left 2nd toe   Plan of Care:  1. Patient evaluated. X-Rays reviewed.  2. Recommended OTC Motrin as needed.  3. Recommended good shoe gear.  4. Return to clinic as needed.      Edrick Kins, DPM [facility]  Dr. Edrick Kins, DPM    [address], [place] [address]                Office [phone]   Fax [phone]

## 2018-12-13 ENCOUNTER — Encounter: Payer: Self-pay | Admitting: Gynecology

## 2018-12-16 ENCOUNTER — Other Ambulatory Visit: Payer: Self-pay

## 2018-12-19 ENCOUNTER — Encounter: Payer: BC Managed Care – PPO | Admitting: Women's Health

## 2018-12-19 ENCOUNTER — Ambulatory Visit (INDEPENDENT_AMBULATORY_CARE_PROVIDER_SITE_OTHER): Payer: BC Managed Care – PPO | Admitting: Women's Health

## 2018-12-19 ENCOUNTER — Other Ambulatory Visit: Payer: Self-pay

## 2018-12-19 ENCOUNTER — Encounter: Payer: Self-pay | Admitting: Women's Health

## 2018-12-19 VITALS — BP 130/78 | Ht 65.0 in | Wt 155.0 lb

## 2018-12-19 DIAGNOSIS — Z23 Encounter for immunization: Secondary | ICD-10-CM

## 2018-12-19 DIAGNOSIS — Z13820 Encounter for screening for osteoporosis: Secondary | ICD-10-CM

## 2018-12-19 DIAGNOSIS — Z01419 Encounter for gynecological examination (general) (routine) without abnormal findings: Secondary | ICD-10-CM | POA: Diagnosis not present

## 2018-12-19 NOTE — Patient Instructions (Signed)

## 2018-12-19 NOTE — Progress Notes (Signed)
Breanna Palmer [birth year] [id number]    History:    Presents for annual exam.  1997 TVH for benign polyps on no HRT.  Normal Pap and mammogram history.  2015 mild osteopenia -1.1 at hip.  2012 normal colonoscopy.  Has had both Shingrix and Pneumovax.  Primary care manages hypertension, diabetes, hypercholesteremia, anxiety and depression.    Past medical history, past surgical history, family history and social history were all reviewed and documented in the EPIC chart.  Retired Pharmacist, hospital.  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  Father prostate cancer, mother diabetes.  ROS:  A ROS was performed and pertinent positives and negatives are included.  Exam:  Vitals:   12/19/18 0925  BP: 130/78  Weight: 155 lb (70.3 kg)  Height: 5\' 5"  (1.651 m)   Body mass index is 25.79 kg/m.   General appearance:  Normal Thyroid:  Symmetrical, normal in size, without palpable masses or nodularity. Respiratory  Auscultation:  Clear without wheezing or rhonchi Cardiovascular  Auscultation:  Regular rate, without rubs, murmurs or gallops  Edema/varicosities:  Not grossly evident Abdominal  Soft,nontender, without masses, guarding or rebound.  Liver/spleen:  No organomegaly noted  Hernia:  None appreciated  Skin  Inspection:  Grossly normal   Breasts: Examined lying and sitting.     Right: Without masses, retractions, discharge or axillary adenopathy.     Left: Without masses, retractions, discharge or axillary adenopathy. Gentitourinary   Inguinal/mons:  Normal without inguinal adenopathy  External genitalia:  Normal  BUS/Urethra/Skene's glands:  Normal  Vagina: Mild atrophy   Cervix: And uterus absent   Adnexa/parametria:     Rt: Without masses or tenderness.   Lt: Without masses or tenderness.  Anus and perineum: Normal  Digital rectal exam: Normal sphincter tone without palpated masses or tenderness  Assessment/Plan:  58 y.o. MWF G3, P2 for annual exam with  no complaints.  58 TVH for benign call uterine polyps on no HRT Hypertension, diabetes, hypercholesteremia, anxiety/depression-primary care manages labs and meds  Plan: SBEs, continue annual screening mammogram, calcium rich foods, vitamin D 2000 daily encouraged.  Reviewed importance of increasing weightbearing and balance type exercise, fall prevention discussed.  Repeat DEXA will schedule.  Continue over-the-counter vaginal lubricants for dryness.  Self-care and leisure activities encouraged.  Pap screening guidelines reviewed.    [place], 9:53 AM 12/19/2018

## 2019-01-17 ENCOUNTER — Other Ambulatory Visit: Payer: Self-pay

## 2019-01-18 ENCOUNTER — Other Ambulatory Visit: Payer: Self-pay | Admitting: Women's Health

## 2019-01-18 ENCOUNTER — Ambulatory Visit (INDEPENDENT_AMBULATORY_CARE_PROVIDER_SITE_OTHER): Payer: BC Managed Care – PPO

## 2019-01-18 DIAGNOSIS — M8589 Other specified disorders of bone density and structure, multiple sites: Secondary | ICD-10-CM

## 2019-01-18 DIAGNOSIS — Z13820 Encounter for screening for osteoporosis: Secondary | ICD-10-CM

## 2019-01-18 DIAGNOSIS — Z780 Asymptomatic menopausal state: Secondary | ICD-10-CM

## 2019-04-18 ENCOUNTER — Other Ambulatory Visit: Payer: Self-pay | Admitting: Neurological Surgery

## 2019-04-19 ENCOUNTER — Ambulatory Visit: Payer: BC Managed Care – PPO | Attending: Internal Medicine

## 2019-04-19 DIAGNOSIS — Z20822 Contact with and (suspected) exposure to covid-19: Secondary | ICD-10-CM

## 2019-04-20 LAB — NOVEL CORONAVIRUS, NAA: SARS-CoV-2, NAA: NOT DETECTED

## 2019-04-26 NOTE — Progress Notes (Signed)
[id number] [place], [place] - [address] [place] [address] Phone: [phone] Fax: [phone]      Your procedure is scheduled on Monday 05/01/2019.  Report to [facility] Main Entrance "A" at 09:00 A.M., and check in at the Admitting office.  Call this number if you have problems the morning of surgery:  [phone]  Call [phone] if you have any questions prior to your surgery date Monday-Friday 8am-4pm    Remember:  Do not eat or drink after midnight the night before your surgery     Take these medicines the morning of surgery with A SIP OF WATER: Clonazepam (Klonopin) - if needed Duloxetine (Cymbalta) Metoprolol succinate (Toprol-XL) Pantoprazole (Protonix)  7 days prior to surgery STOP taking any Aspirin (unless otherwise instructed by your surgeon), Aleve, Naproxen, Ibuprofen, Motrin, Advil, Goody's, BC's, all herbal medications, fish oil, and all vitamins.   WHAT DO I DO ABOUT MY DIABETES MEDICATION?   Marland Kitchen Do not take oral diabetes medicines (pills) the morning of surgery. - Do NOT take your metformin (Glucophage) the day of surgery.   HOW TO MANAGE YOUR DIABETES BEFORE AND AFTER SURGERY  Why is it important to control my blood sugar before and after surgery? . Improving blood sugar levels before and after surgery helps healing and can limit problems. . A way of improving blood sugar control is eating a healthy diet by: o  Eating less sugar and carbohydrates o  Increasing activity/exercise o  Talking with your doctor about reaching your blood sugar goals . High blood sugars (greater than 180 mg/dL) can raise your risk of infections and slow your recovery, so you will need to focus on controlling your diabetes during the weeks before surgery. . Make sure that the doctor who takes care of your diabetes knows about your planned surgery including the date and location.  How do I manage my  blood sugar before surgery? . Check your blood sugar at least 4 times a day, starting 2 days before surgery, to make sure that the level is not too high or low. . Check your blood sugar the morning of your surgery when you wake up and every 2 hours until you get to the Short Stay unit. o If your blood sugar is less than 70 mg/dL, you will need to treat for low blood sugar: - Do not take insulin. - Treat a low blood sugar (less than 70 mg/dL) with  cup of clear juice (cranberry or apple), 4 glucose tablets, OR glucose gel. - Recheck blood sugar in 15 minutes after treatment (to make sure it is greater than 70 mg/dL). If your blood sugar is not greater than 70 mg/dL on recheck, call [phone] for further instructions. . Report your blood sugar to the short stay nurse when you get to Short Stay.  . If you are admitted to the hospital after surgery: o Your blood sugar will be checked by the staff and you will probably be given insulin after surgery (instead of oral diabetes medicines) to make sure you have good blood sugar levels. o The goal for blood sugar control after surgery is 80-180 mg/dL.     The Morning of Surgery  Do not wear jewelry, make-up or nail polish.  Do not wear lotions, powders, perfumes, or deodorant  Do not shave 48 hours prior to surgery.  Men may shave face and neck.  Do not bring valuables to the hospital.  [facility] is not responsible for any belongings or valuables.  If you are a smoker, DO NOT Smoke 24 hours prior to surgery  If you wear a CPAP at night please bring your mask the morning of surgery   Remember that you must have someone to transport you home after your surgery, and remain with you for 24 hours if you are discharged the same day.   Please bring cases for contacts, glasses, hearing aids, dentures or bridgework because it cannot be worn into surgery.    Leave your suitcase in the car.  After surgery it may be brought to your room.  For  patients admitted to the hospital, discharge time will be determined by your treatment team.  Patients discharged the day of surgery will not be allowed to drive home.    Special instructions:   - Preparing For Surgery  Before surgery, you can play an important role. Because skin is not sterile, your skin needs to be as free of germs as possible. You can reduce the number of germs on your skin by washing with CHG (chlorahexidine gluconate) Soap before surgery.  CHG is an antiseptic cleaner which kills germs and bonds with the skin to continue killing germs even after washing.    Oral Hygiene is also important to reduce your risk of infection.  Remember - BRUSH YOUR TEETH THE MORNING OF SURGERY WITH YOUR REGULAR TOOTHPASTE  Please do not use if you have an allergy to CHG or antibacterial soaps. If your skin becomes reddened/irritated stop using the CHG.  Do not shave (including legs and underarms) for at least 48 hours prior to first CHG shower. It is OK to shave your face.  Please follow these instructions carefully.   1. Shower the NIGHT BEFORE SURGERY and the MORNING OF SURGERY with CHG Soap.   2. If you chose to wash your hair, wash your hair first as usual with your normal shampoo.  3. After you shampoo, rinse your hair and body thoroughly to remove the shampoo.  4. Use CHG as you would any other liquid soap. You can apply CHG directly to the skin and wash gently with a scrungie or a clean washcloth.   5. Apply the CHG Soap to your body ONLY FROM THE NECK DOWN.  Do not use on open wounds or open sores. Avoid contact with your eyes, ears, mouth and genitals (private parts). Wash Face and genitals (private parts)  with your normal soap.   6. Wash thoroughly, paying special attention to the area where your surgery will be performed.  7. Thoroughly rinse your body with warm water from the neck down.  8. DO NOT shower/wash with your normal soap after using and rinsing off the  CHG Soap.  9. Pat yourself dry with a CLEAN TOWEL.  10. Wear CLEAN PAJAMAS to bed the night before surgery, wear comfortable clothes the morning of surgery  11. Place CLEAN SHEETS on your bed the night of your first shower and DO NOT SLEEP WITH PETS.    Day of Surgery:  Please shower the morning of surgery with the CHG soap Do not apply any deodorants/lotions. Please wear clean clothes to the hospital/surgery center.   Remember to brush your teeth WITH YOUR REGULAR TOOTHPASTE.   Please read over the following fact sheets that you were given.

## 2019-04-27 ENCOUNTER — Other Ambulatory Visit: Payer: Self-pay

## 2019-04-27 ENCOUNTER — Other Ambulatory Visit (HOSPITAL_COMMUNITY)
Admission: RE | Admit: 2019-04-27 | Discharge: 2019-04-27 | Disposition: A | Discharge status: Home / Self Care | Payer: BC Managed Care – PPO | Source: Ambulatory Visit | Attending: Neurological Surgery | Admitting: Neurological Surgery

## 2019-04-27 ENCOUNTER — Ambulatory Visit (HOSPITAL_COMMUNITY)
Admission: RE | Admit: 2019-04-27 | Discharge: 2019-04-27 | Disposition: A | Discharge status: Home / Self Care | Payer: BC Managed Care – PPO | Source: Ambulatory Visit | Attending: Neurological Surgery | Admitting: Neurological Surgery

## 2019-04-27 ENCOUNTER — Encounter (HOSPITAL_COMMUNITY)
Admission: RE | Admit: 2019-04-27 | Discharge: 2019-04-27 | Disposition: A | Discharge status: Home / Self Care | Payer: BC Managed Care – PPO | Source: Ambulatory Visit | Attending: Neurological Surgery | Admitting: Neurological Surgery

## 2019-04-27 ENCOUNTER — Encounter (HOSPITAL_COMMUNITY): Payer: Self-pay

## 2019-04-27 DIAGNOSIS — R9431 Abnormal electrocardiogram [ECG] [EKG]: Secondary | ICD-10-CM | POA: Diagnosis not present

## 2019-04-27 DIAGNOSIS — Z20822 Contact with and (suspected) exposure to covid-19: Secondary | ICD-10-CM | POA: Insufficient documentation

## 2019-04-27 DIAGNOSIS — M4310 Spondylolisthesis, site unspecified: Secondary | ICD-10-CM | POA: Diagnosis present

## 2019-04-27 HISTORY — DX: Pneumonia, unspecified organism: J18.9

## 2019-04-27 HISTORY — DX: Prediabetes: R73.03

## 2019-04-27 HISTORY — DX: Cardiac arrhythmia, unspecified: I49.9

## 2019-04-27 HISTORY — DX: Gastro-esophageal reflux disease without esophagitis: K21.9

## 2019-04-27 HISTORY — DX: Personal history of urinary calculi: Z87.442

## 2019-04-27 LAB — CBC WITH DIFFERENTIAL/PLATELET
Abs Immature Granulocytes: 0.06 10*3/uL (ref 0.00–0.07)
Basophils Absolute: 0.1 10*3/uL (ref 0.0–0.1)
Basophils Relative: 1 %
Eosinophils Absolute: 0.2 10*3/uL (ref 0.0–0.5)
Eosinophils Relative: 2 %
HCT: 42.8 % (ref 36.0–46.0)
Hemoglobin: 13.9 g/dL (ref 12.0–15.0)
Immature Granulocytes: 1 %
Lymphocytes Relative: 28 %
Lymphs Abs: 2 10*3/uL (ref 0.7–4.0)
MCH: 29.8 pg (ref 26.0–34.0)
MCHC: 32.5 g/dL (ref 30.0–36.0)
MCV: 91.6 fL (ref 80.0–100.0)
Monocytes Absolute: 0.7 10*3/uL (ref 0.1–1.0)
Monocytes Relative: 10 %
Neutro Abs: 4.2 10*3/uL (ref 1.7–7.7)
Neutrophils Relative %: 58 %
Platelets: 333 10*3/uL (ref 150–400)
RBC: 4.67 MIL/uL (ref 3.87–5.11)
RDW: 13 % (ref 11.5–15.5)
WBC: 7.2 10*3/uL (ref 4.0–10.5)
nRBC: 0 % (ref 0.0–0.2)

## 2019-04-27 LAB — BASIC METABOLIC PANEL
Anion gap: 11 (ref 5–15)
BUN: 15 mg/dL (ref 6–20)
CO2: 24 mmol/L (ref 22–32)
Calcium: 9.1 mg/dL (ref 8.9–10.3)
Chloride: 106 mmol/L (ref 98–111)
Creatinine, Ser: 0.62 mg/dL (ref 0.44–1.00)
GFR calc Af Amer: >60 mL/min (ref >60)
GFR calc non Af Amer: >60 mL/min (ref >60)
Glucose, Bld: 113 mg/dL — ABNORMAL HIGH (ref 70–99)
Potassium: 4.1 mmol/L (ref 3.5–5.1)
Sodium: 141 mmol/L (ref 135–145)

## 2019-04-27 LAB — TYPE AND SCREEN
ABO/RH(D): O POS
Antibody Screen: NEGATIVE

## 2019-04-27 LAB — SARS CORONAVIRUS 2 (TAT 6-24 HRS): SARS Coronavirus 2: NEGATIVE

## 2019-04-27 LAB — PROTIME-INR
INR: 0.8 (ref 0.8–1.2)
Prothrombin Time: 11.4 seconds (ref 11.4–15.2)

## 2019-04-27 LAB — HEMOGLOBIN A1C
Hgb A1c MFr Bld: 5.8 % — ABNORMAL HIGH (ref 4.8–5.6)
Mean Plasma Glucose: 119.76 mg/dL

## 2019-04-27 LAB — ABO/RH: ABO/RH(D): O POS

## 2019-04-27 LAB — SURGICAL PCR SCREEN
MRSA, PCR: NEGATIVE
Staphylococcus aureus: NEGATIVE

## 2019-04-27 LAB — GLUCOSE, CAPILLARY: Glucose-Capillary: 98 mg/dL (ref 70–99)

## 2019-04-27 NOTE — Progress Notes (Signed)
PCP - Kathryne Hitch, NP Cardiologist - Per patient, formerly Dr. Romeo Apple, MD; patient states she no longer has to see a cardiologist regularly, she only saw Dr. Doreatha Lew for "elevated HR" years ago.  PPM/ICD - Denies  Chest x-ray - 04/27/19 EKG - 04/27/19 Stress Test - Per patient ~ 2005 at Dr. Dorothyann Peng Tennant's office ECHO - Patient unsure Cardiac Cath - Denies  Sleep Study - Yes CPAP - Per patient, she stopped wearing it years ago  Patient states she is Pre-diabetic Fasting Blood Sugar - Patient unsure Patient does not check blood sugar CBG @ PAT was 98  Blood Thinner Instructions: N/A Aspirin Instructions: N/A  ERAS Protcol - N/A PRE-SURGERY Ensure or G2- N/A  COVID TEST- 04/27/19   Anesthesia review: Yes, stress test requested from Dr. Dorothyann Peng Tennant's office.  Patient denies shortness of breath, fever, cough and chest pain at PAT appointment   All instructions explained to the patient, with a verbal understanding of the material. Patient agrees to go over the instructions while at home for a better understanding. Patient also instructed to self quarantine after being tested for COVID-19. The opportunity to ask questions was provided.

## 2019-04-28 NOTE — Anesthesia Preprocedure Evaluation (Addendum)
Anesthesia Evaluation  Patient identified by MRN, date of birth, ID band Patient awake    Reviewed: Allergy & Precautions, Patient's Chart, lab work & pertinent test results, reviewed documented beta blocker date and time   Airway Mallampati: II  TM Distance: >3 FB Neck ROM: Full    Dental  (+) Teeth Intact, Dental Advisory Given, Implants, Caps   Pulmonary sleep apnea , former smoker,    Pulmonary exam normal breath sounds clear to auscultation       Cardiovascular Normal cardiovascular exam+ dysrhythmias  Rhythm:Regular Rate:Normal     Neuro/Psych PSYCHIATRIC DISORDERS Anxiety Depression Spondylolisthesis  Neuromuscular disease    GI/Hepatic Neg liver ROS, PUD, GERD  Medicated and Controlled,  Endo/Other  negative endocrine ROS  Renal/GU negative Renal ROS     Musculoskeletal  (+) Arthritis , Fibromyalgia -  Abdominal   Peds  Hematology negative hematology ROS (+)   Anesthesia Other Findings Day of surgery medications reviewed with the patient.  Reproductive/Obstetrics                           Anesthesia Physical Anesthesia Plan  ASA: II  Anesthesia Plan: General   Post-op Pain Management:    Induction: Intravenous  PONV Risk Score and Plan: 3 and Midazolam, Ondansetron, Dexamethasone and Diphenhydramine  Airway Management Planned: Oral ETT  Additional Equipment:   Intra-op Plan:   Post-operative Plan: Extubation in OR  Informed Consent: I have reviewed the patients History and Physical, chart, labs and discussed the procedure including the risks, benefits and alternatives for the proposed anesthesia with the patient or authorized representative who has indicated his/her understanding and acceptance.     Dental advisory given  Plan Discussed with: CRNA  Anesthesia Plan Comments: (2nd PIV  Hx of tachypalpitations, maintained on metoprolol, followed by PCP. Denied  palpitations per last OV note 03/13/19.  Preop labs reviewed, unremarkable, A1c 5.8.  EKG 04/27/19: Normal sinus rhythm. Rate 89. Left ventricular hypertrophy with QRS widening and repolarization abnormality)   Anesthesia Quick Evaluation

## 2019-05-01 ENCOUNTER — Encounter (HOSPITAL_COMMUNITY)
Admission: RE | Disposition: A | Discharge status: Home / Self Care | Payer: Self-pay | Source: Home / Self Care | Attending: Neurological Surgery

## 2019-05-01 ENCOUNTER — Other Ambulatory Visit: Payer: Self-pay

## 2019-05-01 ENCOUNTER — Inpatient Hospital Stay (HOSPITAL_COMMUNITY): Payer: BC Managed Care – PPO | Admitting: Anesthesiology

## 2019-05-01 ENCOUNTER — Inpatient Hospital Stay (HOSPITAL_COMMUNITY)
Admission: RE | Admit: 2019-05-01 | Discharge: 2019-05-03 | DRG: 460 | Disposition: A | Discharge status: Home / Self Care | Payer: BC Managed Care – PPO | Attending: Neurological Surgery | Admitting: Neurological Surgery

## 2019-05-01 ENCOUNTER — Inpatient Hospital Stay (HOSPITAL_COMMUNITY): Payer: BC Managed Care – PPO

## 2019-05-01 ENCOUNTER — Inpatient Hospital Stay (HOSPITAL_COMMUNITY): Payer: BC Managed Care – PPO | Admitting: Physician Assistant

## 2019-05-01 ENCOUNTER — Encounter (HOSPITAL_COMMUNITY): Payer: Self-pay | Admitting: Neurological Surgery

## 2019-05-01 DIAGNOSIS — Z981 Arthrodesis status: Secondary | ICD-10-CM

## 2019-05-01 DIAGNOSIS — E785 Hyperlipidemia, unspecified: Secondary | ICD-10-CM | POA: Diagnosis present

## 2019-05-01 DIAGNOSIS — Z7289 Other problems related to lifestyle: Secondary | ICD-10-CM | POA: Diagnosis not present

## 2019-05-01 DIAGNOSIS — Z885 Allergy status to narcotic agent status: Secondary | ICD-10-CM | POA: Diagnosis not present

## 2019-05-01 DIAGNOSIS — Z79899 Other long term (current) drug therapy: Secondary | ICD-10-CM | POA: Diagnosis not present

## 2019-05-01 DIAGNOSIS — Z419 Encounter for procedure for purposes other than remedying health state, unspecified: Secondary | ICD-10-CM

## 2019-05-01 DIAGNOSIS — F419 Anxiety disorder, unspecified: Secondary | ICD-10-CM | POA: Diagnosis present

## 2019-05-01 DIAGNOSIS — G4730 Sleep apnea, unspecified: Secondary | ICD-10-CM | POA: Diagnosis present

## 2019-05-01 DIAGNOSIS — Z90710 Acquired absence of both cervix and uterus: Secondary | ICD-10-CM

## 2019-05-01 DIAGNOSIS — Z20822 Contact with and (suspected) exposure to covid-19: Secondary | ICD-10-CM | POA: Diagnosis present

## 2019-05-01 DIAGNOSIS — M532X6 Spinal instabilities, lumbar region: Principal | ICD-10-CM | POA: Diagnosis present

## 2019-05-01 DIAGNOSIS — K219 Gastro-esophageal reflux disease without esophagitis: Secondary | ICD-10-CM | POA: Diagnosis present

## 2019-05-01 DIAGNOSIS — Z882 Allergy status to sulfonamides status: Secondary | ICD-10-CM | POA: Diagnosis not present

## 2019-05-01 DIAGNOSIS — Z833 Family history of diabetes mellitus: Secondary | ICD-10-CM | POA: Diagnosis not present

## 2019-05-01 DIAGNOSIS — Z7984 Long term (current) use of oral hypoglycemic drugs: Secondary | ICD-10-CM | POA: Diagnosis not present

## 2019-05-01 DIAGNOSIS — M797 Fibromyalgia: Secondary | ICD-10-CM | POA: Diagnosis present

## 2019-05-01 DIAGNOSIS — G8929 Other chronic pain: Secondary | ICD-10-CM | POA: Diagnosis present

## 2019-05-01 DIAGNOSIS — Z87891 Personal history of nicotine dependence: Secondary | ICD-10-CM | POA: Diagnosis not present

## 2019-05-01 DIAGNOSIS — F329 Major depressive disorder, single episode, unspecified: Secondary | ICD-10-CM | POA: Diagnosis present

## 2019-05-01 DIAGNOSIS — Z800 Family history of malignant neoplasm of digestive organs: Secondary | ICD-10-CM

## 2019-05-01 DIAGNOSIS — J302 Other seasonal allergic rhinitis: Secondary | ICD-10-CM | POA: Diagnosis present

## 2019-05-01 HISTORY — PX: MAXIMUM ACCESS (MAS) TRANSFORAMINAL LUMBAR INTERBODY FUSION (TLIF) 2 LEVEL: SHX6393

## 2019-05-01 LAB — GLUCOSE, CAPILLARY
Glucose-Capillary: 120 mg/dL — ABNORMAL HIGH (ref 70–99)
Glucose-Capillary: 96 mg/dL (ref 70–99)
Glucose-Capillary: 110 mg/dL — ABNORMAL HIGH (ref 70–99)
Glucose-Capillary: 128 mg/dL — ABNORMAL HIGH (ref 70–99)
Glucose-Capillary: 223 mg/dL — ABNORMAL HIGH (ref 70–99)

## 2019-05-01 SURGERY — MAXIMUM ACCESS (MAS) TRANSFORAMINAL LUMBAR INTERBODY FUSION (TLIF) 2 LEVEL
Anesthesia: General | Site: Back

## 2019-05-01 MED ORDER — FENTANYL CITRATE (PF) 100 MCG/2ML IJ SOLN
INTRAMUSCULAR | Status: AC
  Filled 2019-05-01: qty 2

## 2019-05-01 MED ORDER — CEFAZOLIN SODIUM-DEXTROSE 2-4 GM/100ML-% IV SOLN
2.0000 g | Freq: Three times a day (TID) | INTRAVENOUS | Status: AC
  Administered 2019-05-01 – 2019-05-02 (×2): 2 g via INTRAVENOUS
  Filled 2019-05-01 (×2): qty 100

## 2019-05-01 MED ORDER — PROPOFOL 10 MG/ML IV BOLUS
INTRAVENOUS | Status: DC | PRN
  Administered 2019-05-01: 150 mg via INTRAVENOUS

## 2019-05-01 MED ORDER — MIDAZOLAM HCL 2 MG/2ML IJ SOLN
INTRAMUSCULAR | Status: DC | PRN
  Administered 2019-05-01: 2 mg via INTRAVENOUS

## 2019-05-01 MED ORDER — POTASSIUM CHLORIDE IN NACL 20-0.9 MEQ/L-% IV SOLN: INTRAVENOUS | Status: DC

## 2019-05-01 MED ORDER — THROMBIN 20000 UNITS EX SOLR
CUTANEOUS | Status: DC | PRN
  Administered 2019-05-01: 20 mL via TOPICAL

## 2019-05-01 MED ORDER — PROPOFOL 500 MG/50ML IV EMUL
INTRAVENOUS | Status: DC | PRN
  Administered 2019-05-01: 65 ug/kg/min via INTRAVENOUS

## 2019-05-01 MED ORDER — FENTANYL CITRATE (PF) 250 MCG/5ML IJ SOLN
INTRAMUSCULAR | Status: AC
  Filled 2019-05-01: qty 5

## 2019-05-01 MED ORDER — MENTHOL 3 MG MT LOZG: 1.0000 | LOZENGE | OROMUCOSAL | Status: DC | PRN

## 2019-05-01 MED ORDER — METHOCARBAMOL 1000 MG/10ML IJ SOLN
500.0000 mg | Freq: Four times a day (QID) | INTRAVENOUS | Status: DC | PRN
  Filled 2019-05-01: qty 5

## 2019-05-01 MED ORDER — LIDOCAINE 2% (20 MG/ML) 5 ML SYRINGE
INTRAMUSCULAR | Status: DC | PRN
  Administered 2019-05-01: 80 mg via INTRAVENOUS

## 2019-05-01 MED ORDER — DULOXETINE HCL 60 MG PO CPEP
60.0000 mg | ORAL_CAPSULE | Freq: Every day | ORAL | Status: DC
  Administered 2019-05-02 – 2019-05-03 (×2): 60 mg via ORAL
  Filled 2019-05-01: qty 2
  Filled 2019-05-01: qty 1
  Filled 2019-05-01: qty 2
  Filled 2019-05-01: qty 1

## 2019-05-01 MED ORDER — INSULIN ASPART 100 UNIT/ML ~~LOC~~ SOLN
0.0000 [IU] | Freq: Every day | SUBCUTANEOUS | Status: DC
  Administered 2019-05-01: 2 [IU] via SUBCUTANEOUS

## 2019-05-01 MED ORDER — PHENYLEPHRINE HCL-NACL 10-0.9 MG/250ML-% IV SOLN
INTRAVENOUS | Status: DC | PRN
  Administered 2019-05-01: 25 ug/min via INTRAVENOUS

## 2019-05-01 MED ORDER — ACETAMINOPHEN 500 MG PO TABS
1000.0000 mg | ORAL_TABLET | Freq: Once | ORAL | Status: AC
  Administered 2019-05-01: 1000 mg via ORAL
  Filled 2019-05-01: qty 2

## 2019-05-01 MED ORDER — ARTHREX ANGEL - ACD-A SOLUTION (CHARTING ONLY) OPTIME
TOPICAL | Status: DC | PRN
  Administered 2019-05-01 (×2): 10 mL via TOPICAL

## 2019-05-01 MED ORDER — DEXAMETHASONE SODIUM PHOSPHATE 10 MG/ML IJ SOLN
10.0000 mg | Freq: Once | INTRAMUSCULAR | Status: AC
  Administered 2019-05-01: 10 mg via INTRAVENOUS
  Filled 2019-05-01: qty 1

## 2019-05-01 MED ORDER — ONDANSETRON HCL 4 MG/2ML IJ SOLN
INTRAMUSCULAR | Status: DC | PRN
  Administered 2019-05-01: 4 mg via INTRAVENOUS

## 2019-05-01 MED ORDER — PHENYLEPHRINE 40 MCG/ML (10ML) SYRINGE FOR IV PUSH (FOR BLOOD PRESSURE SUPPORT)
PREFILLED_SYRINGE | INTRAVENOUS | Status: DC | PRN
  Administered 2019-05-01: 120 ug via INTRAVENOUS

## 2019-05-01 MED ORDER — FENTANYL CITRATE (PF) 100 MCG/2ML IJ SOLN
INTRAMUSCULAR | Status: DC | PRN
  Administered 2019-05-01 (×2): 100 ug via INTRAVENOUS
  Administered 2019-05-01 (×2): 50 ug via INTRAVENOUS

## 2019-05-01 MED ORDER — THROMBIN 5000 UNITS EX SOLR
CUTANEOUS | Status: AC
  Filled 2019-05-01: qty 5000

## 2019-05-01 MED ORDER — ROCURONIUM BROMIDE 10 MG/ML (PF) SYRINGE
PREFILLED_SYRINGE | INTRAVENOUS | Status: AC
  Filled 2019-05-01: qty 20

## 2019-05-01 MED ORDER — PROMETHAZINE HCL 25 MG/ML IJ SOLN: 6.2500 mg | INTRAMUSCULAR | Status: DC | PRN

## 2019-05-01 MED ORDER — PANTOPRAZOLE SODIUM 40 MG PO TBEC
40.0000 mg | DELAYED_RELEASE_TABLET | Freq: Every day | ORAL | Status: DC
  Administered 2019-05-02 – 2019-05-03 (×2): 40 mg via ORAL
  Filled 2019-05-01 (×2): qty 1

## 2019-05-01 MED ORDER — CHLORHEXIDINE GLUCONATE CLOTH 2 % EX PADS: 6.0000 | MEDICATED_PAD | Freq: Once | CUTANEOUS | Status: DC

## 2019-05-01 MED ORDER — BUPIVACAINE HCL (PF) 0.25 % IJ SOLN
INTRAMUSCULAR | Status: DC | PRN
  Administered 2019-05-01: 5 mL

## 2019-05-01 MED ORDER — ACETAMINOPHEN 325 MG PO TABS: 650.0000 mg | ORAL_TABLET | ORAL | Status: DC | PRN

## 2019-05-01 MED ORDER — INSULIN ASPART 100 UNIT/ML ~~LOC~~ SOLN
0.0000 [IU] | Freq: Three times a day (TID) | SUBCUTANEOUS | Status: DC
  Administered 2019-05-02: 2 [IU] via SUBCUTANEOUS

## 2019-05-01 MED ORDER — OXYCODONE HCL 5 MG PO TABS
5.0000 mg | ORAL_TABLET | ORAL | Status: DC | PRN
  Administered 2019-05-01 – 2019-05-02 (×5): 5 mg via ORAL
  Filled 2019-05-01 (×4): qty 1

## 2019-05-01 MED ORDER — BUPIVACAINE HCL (PF) 0.25 % IJ SOLN
INTRAMUSCULAR | Status: AC
  Filled 2019-05-01: qty 30

## 2019-05-01 MED ORDER — PROPOFOL 10 MG/ML IV BOLUS
INTRAVENOUS | Status: AC
  Filled 2019-05-01: qty 20

## 2019-05-01 MED ORDER — ROCURONIUM BROMIDE 10 MG/ML (PF) SYRINGE
PREFILLED_SYRINGE | INTRAVENOUS | Status: DC | PRN
  Administered 2019-05-01: 40 mg via INTRAVENOUS

## 2019-05-01 MED ORDER — ACETAMINOPHEN 650 MG RE SUPP: 650.0000 mg | RECTAL | Status: DC | PRN

## 2019-05-01 MED ORDER — MIDAZOLAM HCL 2 MG/2ML IJ SOLN
INTRAMUSCULAR | Status: AC
  Filled 2019-05-01: qty 2

## 2019-05-01 MED ORDER — LIDOCAINE 2% (20 MG/ML) 5 ML SYRINGE
INTRAMUSCULAR | Status: AC
  Filled 2019-05-01: qty 5

## 2019-05-01 MED ORDER — THROMBIN 20000 UNITS EX SOLR
CUTANEOUS | Status: AC
  Filled 2019-05-01: qty 20000

## 2019-05-01 MED ORDER — THROMBIN 5000 UNITS EX SOLR
OROMUCOSAL | Status: DC | PRN
  Administered 2019-05-01: 5 mL via TOPICAL

## 2019-05-01 MED ORDER — INSULIN ASPART 100 UNIT/ML ~~LOC~~ SOLN
0.0000 [IU] | Freq: Three times a day (TID) | SUBCUTANEOUS | Status: DC
  Administered 2019-05-01: 2 [IU] via SUBCUTANEOUS

## 2019-05-01 MED ORDER — PHENOL 1.4 % MT LIQD: 1.0000 | OROMUCOSAL | Status: DC | PRN

## 2019-05-01 MED ORDER — OXYCODONE HCL 5 MG PO TABS
ORAL_TABLET | ORAL | Status: AC
  Filled 2019-05-01: qty 1

## 2019-05-01 MED ORDER — SODIUM CHLORIDE 0.9 % IV SOLN: 250.0000 mL | INTRAVENOUS | Status: DC

## 2019-05-01 MED ORDER — ONDANSETRON HCL 4 MG/2ML IJ SOLN
INTRAMUSCULAR | Status: AC
  Filled 2019-05-01: qty 2

## 2019-05-01 MED ORDER — SENNA 8.6 MG PO TABS
1.0000 | ORAL_TABLET | Freq: Two times a day (BID) | ORAL | Status: DC
  Administered 2019-05-01 – 2019-05-03 (×4): 8.6 mg via ORAL
  Filled 2019-05-01 (×4): qty 1

## 2019-05-01 MED ORDER — SUGAMMADEX SODIUM 200 MG/2ML IV SOLN
INTRAVENOUS | Status: DC | PRN
  Administered 2019-05-01: 200 mg via INTRAVENOUS

## 2019-05-01 MED ORDER — HEPARIN SODIUM (PORCINE) 1000 UNIT/ML IJ SOLN
INTRAMUSCULAR | Status: DC | PRN
  Administered 2019-05-01 (×2): 5000 [IU]

## 2019-05-01 MED ORDER — SUCCINYLCHOLINE CHLORIDE 200 MG/10ML IV SOSY
PREFILLED_SYRINGE | INTRAVENOUS | Status: DC | PRN
  Administered 2019-05-01: 50 mg via INTRAVENOUS

## 2019-05-01 MED ORDER — METHOCARBAMOL 500 MG PO TABS
500.0000 mg | ORAL_TABLET | Freq: Four times a day (QID) | ORAL | Status: DC | PRN
  Administered 2019-05-01 – 2019-05-03 (×6): 500 mg via ORAL
  Filled 2019-05-01 (×5): qty 1

## 2019-05-01 MED ORDER — METHOCARBAMOL 500 MG PO TABS
ORAL_TABLET | ORAL | Status: AC
  Filled 2019-05-01: qty 1

## 2019-05-01 MED ORDER — ROCURONIUM BROMIDE 10 MG/ML (PF) SYRINGE
PREFILLED_SYRINGE | INTRAVENOUS | Status: AC
  Filled 2019-05-01: qty 10

## 2019-05-01 MED ORDER — ONDANSETRON HCL 4 MG/2ML IJ SOLN: 4.0000 mg | Freq: Four times a day (QID) | INTRAMUSCULAR | Status: DC | PRN

## 2019-05-01 MED ORDER — SODIUM CHLORIDE 0.9% FLUSH: 3.0000 mL | INTRAVENOUS | Status: DC | PRN

## 2019-05-01 MED ORDER — LACTATED RINGERS IV SOLN: INTRAVENOUS | Status: DC

## 2019-05-01 MED ORDER — ONDANSETRON HCL 4 MG PO TABS: 4.0000 mg | ORAL_TABLET | Freq: Four times a day (QID) | ORAL | Status: DC | PRN

## 2019-05-01 MED ORDER — 0.9 % SODIUM CHLORIDE (POUR BTL) OPTIME
TOPICAL | Status: DC | PRN
  Administered 2019-05-01: 1000 mL

## 2019-05-01 MED ORDER — SODIUM CHLORIDE 0.9 % IV SOLN
INTRAVENOUS | Status: DC | PRN
  Administered 2019-05-01: 500 mL

## 2019-05-01 MED ORDER — SODIUM CHLORIDE 0.9% FLUSH
3.0000 mL | Freq: Two times a day (BID) | INTRAVENOUS | Status: DC
  Administered 2019-05-01 – 2019-05-02 (×2): 3 mL via INTRAVENOUS

## 2019-05-01 MED ORDER — HEPARIN SODIUM (PORCINE) 1000 UNIT/ML IJ SOLN
INTRAMUSCULAR | Status: AC
  Filled 2019-05-01: qty 1

## 2019-05-01 MED ORDER — METOPROLOL SUCCINATE 12.5 MG HALF TABLET
12.5000 mg | ORAL_TABLET | Freq: Every day | ORAL | Status: DC
  Administered 2019-05-03: 12.5 mg via ORAL
  Filled 2019-05-01 (×2): qty 1

## 2019-05-01 MED ORDER — METFORMIN HCL 500 MG PO TABS
500.0000 mg | ORAL_TABLET | Freq: Two times a day (BID) | ORAL | Status: DC
  Administered 2019-05-01 – 2019-05-03 (×4): 500 mg via ORAL
  Filled 2019-05-01 (×4): qty 1

## 2019-05-01 MED ORDER — FENTANYL CITRATE (PF) 100 MCG/2ML IJ SOLN
25.0000 ug | INTRAMUSCULAR | Status: DC | PRN
  Administered 2019-05-01: 50 ug via INTRAVENOUS

## 2019-05-01 MED ORDER — LACTATED RINGERS IV SOLN: INTRAVENOUS | Status: DC | PRN

## 2019-05-01 MED ORDER — HYDROMORPHONE HCL 1 MG/ML IJ SOLN
0.5000 mg | INTRAMUSCULAR | Status: DC | PRN
  Administered 2019-05-01 – 2019-05-02 (×2): 0.5 mg via INTRAVENOUS
  Filled 2019-05-01 (×2): qty 0.5

## 2019-05-01 MED ORDER — SODIUM CHLORIDE (PF) 0.9 % IJ SOLN
INTRAMUSCULAR | Status: DC | PRN
  Administered 2019-05-01 (×2): 5 mL

## 2019-05-01 MED ORDER — CEFAZOLIN SODIUM-DEXTROSE 2-4 GM/100ML-% IV SOLN
2.0000 g | INTRAVENOUS | Status: AC
  Administered 2019-05-01: 2 g via INTRAVENOUS
  Filled 2019-05-01: qty 100

## 2019-05-01 SURGICAL SUPPLY — 68 items
BAG DECANTER FOR FLEXI CONT (MISCELLANEOUS) ×3 IMPLANT
BASKET BONE COLLECTION (BASKET) ×3 IMPLANT
BENZOIN TINCTURE PRP APPL 2/3 (GAUZE/BANDAGES/DRESSINGS) ×3 IMPLANT
BLADE CLIPPER SURG (BLADE) IMPLANT
BONE CANC CHIPS 20CC PCAN1/4 (Bone Implant) ×3 IMPLANT
BONE VIVIGEN FORMABLE 10CC (Bone Implant) ×3 IMPLANT
BUR CARBIDE MATCH 3.0 (BURR) ×3 IMPLANT
CANISTER SUCT 3000ML PPV (MISCELLANEOUS) ×3 IMPLANT
CARTRIDGE OIL MAESTRO DRILL (MISCELLANEOUS) IMPLANT
CHIPS CANC BONE 20CC PCAN1/4 (Bone Implant) ×1 IMPLANT
CLIP SPRING STIM LLIF SAFEOP (CLIP) ×3 IMPLANT
CLOSURE WOUND 1/2 X4 (GAUZE/BANDAGES/DRESSINGS) ×1
CNTNR URN SCR LID CUP LEK RST (MISCELLANEOUS) ×1 IMPLANT
CONT SPEC 4OZ STRL OR WHT (MISCELLANEOUS) ×2
COVER BACK TABLE 60X90IN (DRAPES) ×3 IMPLANT
COVER WAND RF STERILE (DRAPES) IMPLANT
DERMABOND ADVANCED (GAUZE/BANDAGES/DRESSINGS) ×2
DERMABOND ADVANCED .7 DNX12 (GAUZE/BANDAGES/DRESSINGS) ×1 IMPLANT
DIFFUSER DRILL AIR PNEUMATIC (MISCELLANEOUS) IMPLANT
DRAPE C-ARM 42X72 X-RAY (DRAPES) ×3 IMPLANT
DRAPE C-ARMOR (DRAPES) ×3 IMPLANT
DRAPE LAPAROTOMY 100X72X124 (DRAPES) ×3 IMPLANT
DRAPE SURG 17X23 STRL (DRAPES) ×3 IMPLANT
DRSG OPSITE POSTOP 4X6 (GAUZE/BANDAGES/DRESSINGS) ×3 IMPLANT
DURAPREP 26ML APPLICATOR (WOUND CARE) ×3 IMPLANT
ELECT KIT SAFEOP EMG/NMJ (ELECTRODE) ×2
ELECT REM PT RETURN 9FT ADLT (ELECTROSURGICAL) ×3
ELECTRODE REM PT RTRN 9FT ADLT (ELECTROSURGICAL) ×1 IMPLANT
EVACUATOR 1/8 PVC DRAIN (DRAIN) ×3 IMPLANT
GAUZE 4X4 16PLY RFD (DISPOSABLE) IMPLANT
GLOVE BIO SURGEON STRL SZ7 (GLOVE) IMPLANT
GLOVE BIO SURGEON STRL SZ8 (GLOVE) ×6 IMPLANT
GLOVE BIOGEL PI IND STRL 7.0 (GLOVE) IMPLANT
GLOVE BIOGEL PI INDICATOR 7.0 (GLOVE)
GOWN STRL REUS W/ TWL LRG LVL3 (GOWN DISPOSABLE) IMPLANT
GOWN STRL REUS W/ TWL XL LVL3 (GOWN DISPOSABLE) ×2 IMPLANT
GOWN STRL REUS W/TWL 2XL LVL3 (GOWN DISPOSABLE) IMPLANT
GOWN STRL REUS W/TWL LRG LVL3 (GOWN DISPOSABLE)
GOWN STRL REUS W/TWL XL LVL3 (GOWN DISPOSABLE) ×4
HEMOSTAT POWDER KIT SURGIFOAM (HEMOSTASIS) IMPLANT
KIT BASIN OR (CUSTOM PROCEDURE TRAY) ×3 IMPLANT
KIT BONE MRW ASP ANGEL CPRP (KITS) ×6 IMPLANT
KIT EMG SRFC ELECT SAFEOP (ELECTRODE) ×1 IMPLANT
KIT TURNOVER KIT B (KITS) ×3 IMPLANT
NEEDLE HYPO 25X1 1.5 SAFETY (NEEDLE) ×3 IMPLANT
NS IRRIG 1000ML POUR BTL (IV SOLUTION) ×6 IMPLANT
OIL CARTRIDGE MAESTRO DRILL (MISCELLANEOUS)
PACK LAMINECTOMY NEURO (CUSTOM PROCEDURE TRAY) ×3 IMPLANT
PAD ARMBOARD 7.5X6 YLW CONV (MISCELLANEOUS) ×9 IMPLANT
PUTTY DBM ALLOSYNC PURE 10CC (Putty) ×3 IMPLANT
ROD LORD LIPPED TI 5.5X65 (Rod) ×6 IMPLANT
SCREW CANC SHANK MOD 5.5X35 (Screw) ×18 IMPLANT
SCREW POLYAXIAL TULIP (Screw) ×18 IMPLANT
SET SCREW (Screw) ×12 IMPLANT
SET SCREW SPNE (Screw) ×6 IMPLANT
SPONGE LAP 4X18 RFD (DISPOSABLE) IMPLANT
SPONGE SURGIFOAM ABS GEL 100 (HEMOSTASIS) IMPLANT
STRIP CLOSURE SKIN 1/2X4 (GAUZE/BANDAGES/DRESSINGS) ×2 IMPLANT
SUT VIC AB 0 CT1 18XCR BRD8 (SUTURE) ×1 IMPLANT
SUT VIC AB 0 CT1 8-18 (SUTURE) ×2
SUT VIC AB 2-0 CP2 18 (SUTURE) ×3 IMPLANT
SUT VIC AB 3-0 SH 8-18 (SUTURE) ×6 IMPLANT
SYR 10ML LL (SYRINGE) ×3 IMPLANT
SYR 3ML LL SCALE MARK (SYRINGE) IMPLANT
TOWEL GREEN STERILE (TOWEL DISPOSABLE) ×3 IMPLANT
TOWEL GREEN STERILE FF (TOWEL DISPOSABLE) ×3 IMPLANT
TRAY FOLEY MTR SLVR 16FR STAT (SET/KITS/TRAYS/PACK) ×3 IMPLANT
WATER STERILE IRR 1000ML POUR (IV SOLUTION) ×3 IMPLANT

## 2019-05-01 NOTE — Op Note (Signed)
05/01/2019  3:45 PM  PATIENT:  Breanna Palmer  59 y.o. 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 Posterior fixation L3-5 inclusive using ATEC cortical pedicle screws.  2. Intertransverse arthrodesis L3-5 using morcellized autograft and allograft soaked with a BM aspirate obtained thru a separate fascial incision over the IC  SURGEON:  Sherley Bounds, MD  ASSISTANTS: Margo Aye, FNP  ANESTHESIA:  General  EBL: 200 ml  Total I/O In: 1300 [I.V.:1300] Out: 560 [Urine:360; Blood:200]  BLOOD ADMINISTERED:none  DRAINS: none   INDICATION FOR PROCEDURE: This patient presented with back pain. Imaging revealed segmental instability L3-5. The patient tried a reasonable attempt at conservative medical measures without relief. I recommended  instrumented fusion to address the segmental  instability.  Patient understood the risks, benefits, and alternatives and potential outcomes and wished to proceed.  PROCEDURE DETAILS:  The patient was brought to the operating room. After induction of generalized endotracheal anesthesia the patient was rolled into the prone position on chest rolls and all pressure points were padded. The patient's lumbar region was cleaned and then prepped with DuraPrep and draped in the usual sterile fashion. Anesthesia was injected and then a dorsal midline incision was made and carried down to the lumbosacral fascia. The fascia was opened and the paraspinous musculature was taken down in a subperiosteal fashion to expose L3-4 and L4-5. A self-retaining retractor was placed. Intraoperative fluoroscopy confirmed my level, and I started with placement of the cortical pedicle screws. The pedicle screw entry zones were identified utilizing surface landmarks and  AP and lateral fluoroscopy. I scored the cortex with the high-speed drill and then used the hand drill  to drill an upward and outward direction into the pedicle. I then tapped line to line. I then placed a 5.5 x 35 mm cortical pedicle screw into the pedicles of L3, L4 and L5 bilaterally.  I then dissected in a suprafascial plane to expose the iliac crest.  Opened the fascia and we used a Jamshidi needle to extract 60 cc of bone marrow aspirate from the iliac crest with the assistance of my nurse practitioner.  This was then spun down by [facility] device and 2 to 4 cc of  BMAC was soaked on morselized allograft for later arthrodesis.  I dried the hole with Surgifoam and closed the fascia.  .  My nurse practitioner assisted in placement of the pedicle screws.  We then decorticated the transverse processes and laid a mixture of morcellized autograft and allograft out over these to perform intertransverse arthrodesis at L3-L5 bilaterally. We then placed lordotic rods into the multiaxial screw heads of the pedicle screws and locked these in position with the locking caps and anti-torque device. We then checked our construct with AP and lateral fluoroscopy. Irrigated with copious amounts of bacitracin-containing saline solution. we then we closed the muscle and the fascia with 0 Vicryl. Closed the subcutaneous tissues with 2-0 Vicryl and subcuticular tissues with 3-0 Vicryl. The skin was closed with benzoin and Steri-Strips. Dressing was then applied, the patient was awakened from general anesthesia and transported to the recovery room in stable condition. At the end of the procedure all sponge, needle and instrument counts were correct.   PLAN OF CARE: admit to inpatient  PATIENT DISPOSITION:  PACU - hemodynamically stable.   Delay start of Pharmacological VTE agent (>24hrs) due to surgical blood loss or risk of bleeding:  yes

## 2019-05-01 NOTE — Transfer of Care (Signed)
Immediate Anesthesia Transfer of Care Note  Patient: Breanna Palmer  Procedure(s) Performed: Transforaminal Lumbar Interbody Fusion - Lumbar Three-Lumbar Four - Lumbar Four-Lumbar Five (N/A Back)  Patient Location: PACU  Anesthesia Type:General  Level of Consciousness: drowsy and patient cooperative  Airway & Oxygen Therapy: Patient Spontanous Breathing  Post-op Assessment: Report given to RN and Post -op Vital signs reviewed and stable  Post vital signs: Reviewed and stable  Last Vitals:  Vitals Value Taken Time  BP 125/77 05/01/19 1549  Temp    Pulse 97 05/01/19 1551  Resp 14 05/01/19 1551  SpO2 93 % 05/01/19 1551  Vitals shown include unvalidated device data.  Last Pain:  Vitals:   05/01/19 0923  PainSc: 6       Patients Stated Pain Goal: 5 ([id number])  Complications: No apparent anesthesia complications

## 2019-05-01 NOTE — Anesthesia Procedure Notes (Signed)

## 2019-05-01 NOTE — Anesthesia Postprocedure Evaluation (Signed)
Anesthesia Post Note  Patient: Breanna Palmer  Procedure(s) Performed: Transforaminal Lumbar Interbody Fusion - Lumbar Three-Lumbar Four - Lumbar Four-Lumbar Five (N/A Back)     Patient location during evaluation: PACU Anesthesia Type: General Level of consciousness: awake and alert Pain management: pain level controlled Vital Signs Assessment: post-procedure vital signs reviewed and stable Respiratory status: spontaneous breathing, nonlabored ventilation, respiratory function stable and patient connected to nasal cannula oxygen Cardiovascular status: blood pressure returned to baseline and stable Postop Assessment: no apparent nausea or vomiting Anesthetic complications: no    Last Vitals:  Vitals:   05/01/19 1644 05/01/19 1940  BP: 116/83 114/72  Pulse: 89 90  Resp: 20 16  Temp: 36.8 C 36.5 C  SpO2: 97% 95%    Last Pain:  Vitals:   05/01/19 1940  TempSrc: Oral  PainSc:                  Catalina Gravel

## 2019-05-01 NOTE — Evaluation (Signed)
Physical Therapy Evaluation Patient Details Name: Breanna Palmer MRN: [id number] DOB: 1960/06/21 Today's Date: 05/01/2019   History of Present Illness  Pt is a 59 y/o female s/p L3-5 posterior fixation. PMH includes fibromyalgia, pre-diabetes, and sleep apnea.   Clinical Impression  Patient is s/p above surgery resulting in the deficits listed below (see PT Problem List). Pt guarded during gait, and required min to min guard A for mobility. Pt required standing rest X 2. Educated about back precautions and generalized walking program. Patient will benefit from skilled PT to increase their independence and safety with mobility (while adhering to their precautions) to allow discharge to the venue listed below.     Follow Up Recommendations No PT follow up    Equipment Recommendations  None recommended by PT    Recommendations for Other Services       Precautions / Restrictions Precautions Precautions: Back Precaution Booklet Issued: Yes (comment) Precaution Comments: Reviewed back precautions with pt.  Restrictions Weight Bearing Restrictions: No      Mobility  Bed Mobility Overal bed mobility: Needs Assistance Bed Mobility: Rolling;Sidelying to Sit;Sit to Sidelying Rolling: Supervision Sidelying to sit: Supervision     Sit to sidelying: Supervision General bed mobility comments: Supervision for safety. Cues for log roll technique.   Transfers Overall transfer level: Needs assistance Equipment used: 1 person hand held assist Transfers: Sit to/from Stand Sit to Stand: Min assist         General transfer comment: Min A for lift assist and steadying. Increased time to stand secondary to pain.   Ambulation/Gait Ambulation/Gait assistance: Min guard Gait Distance (Feet): 120 Feet Assistive device: 1 person hand held assist Gait Pattern/deviations: Step-through pattern;Decreased stride length Gait velocity: Decreased   General Gait Details: Slow, cautious gait.  Required standing rest X2 secondary to fatigue. Educated about walking program to perform at home.   Stairs            Wheelchair Mobility    Modified Rankin (Stroke Patients Only)       Balance Overall balance assessment: Needs assistance Sitting-balance support: No upper extremity supported;Feet supported Sitting balance-Leahy Scale: Good     Standing balance support: Single extremity supported;During functional activity;No upper extremity supported Standing balance-Leahy Scale: Fair Standing balance comment: Able to maintain static standing without UE support                              Pertinent Vitals/Pain Pain Assessment: Faces Faces Pain Scale: Hurts little more Pain Location: back Pain Descriptors / Indicators: Aching;Operative site guarding Pain Intervention(s): Limited activity within patient's tolerance;Monitored during session;Repositioned    Home Living Family/patient expects to be discharged to:: Private residence Living Arrangements: Spouse/significant other Available Help at Discharge: Family Type of Home: House Home Access: Stairs to enter Entrance Stairs-Rails: None Technical brewer of Steps: 5 Home Layout: One level Home Equipment: None      Prior Function Level of Independence: Independent               Hand Dominance        Extremity/Trunk Assessment   Upper Extremity Assessment Upper Extremity Assessment: Defer to OT evaluation    Lower Extremity Assessment Lower Extremity Assessment: Generalized weakness    Cervical / Trunk Assessment Cervical / Trunk Assessment: Other exceptions Cervical / Trunk Exceptions: s/p lumbar surgery  Communication   Communication: No difficulties  Cognition Arousal/Alertness: Awake/alert Behavior During Therapy: WFL for tasks assessed/performed Overall  Cognitive Status: Within Functional Limits for tasks assessed                                         General Comments General comments (skin integrity, edema, etc.): Pt's husband present during session.     Exercises     Assessment/Plan    PT Assessment Patient needs continued PT services  PT Problem List Decreased strength;Decreased balance;Decreased activity tolerance;Decreased mobility;Decreased knowledge of precautions;Pain       PT Treatment Interventions Gait training;Stair training;Functional mobility training;Therapeutic activities;Therapeutic exercise;Balance training;Patient/family education    PT Goals (Current goals can be found in the Care Plan section)  Acute Rehab PT Goals Patient Stated Goal: to go home PT Goal Formulation: With patient Time For Goal Achievement: 05/15/19 Potential to Achieve Goals: Good    Frequency Min 5X/week   Barriers to discharge        Co-evaluation               AM-PAC PT "6 Clicks" Mobility  Outcome Measure Help needed turning from your back to your side while in a flat bed without using bedrails?: None Help needed moving from lying on your back to sitting on the side of a flat bed without using bedrails?: A Little Help needed moving to and from a bed to a chair (including a wheelchair)?: A Little Help needed standing up from a chair using your arms (e.g., wheelchair or bedside chair)?: A Little Help needed to walk in hospital room?: A Little Help needed climbing 3-5 steps with a railing? : A Lot 6 Click Score: 18    End of Session Equipment Utilized During Treatment: Gait belt Activity Tolerance: Patient tolerated treatment well Patient left: in bed;with call bell/phone within reach;with family/visitor present Nurse Communication: Mobility status PT Visit Diagnosis: Muscle weakness (generalized) (M62.81);Pain Pain - part of body: (back)    Time: 1651-1710 PT Time Calculation (min) (ACUTE ONLY): 19 min   Charges:   PT Evaluation $PT Eval Low Complexity: 1 Low          Lou Miner, DPT  Acute  Rehabilitation Services  Pager: [phone] Office: [phone]   Rudean Hitt 05/01/2019, 6:22 PM

## 2019-05-01 NOTE — H&P (Signed)
Subjective: Patient is a 59 y.o. female admitted for lumbar instability. Onset of symptoms was several months ago, rapidly worsening since that time.  The pain is rated severe, and is located at the across the lower back and radiates to thighs. The pain is described as aching and occurs intermittently. The symptoms have been progressive. Symptoms are exacerbated by exercise. MRI or CT showed instability L3-5   Past Medical History:  Diagnosis Date  . Anxiety   . Arthritis   . Depression   . Dysrhythmia   . Dysuria 09/15/2016  . Esophageal ulcer   . Fibromyalgia   . GERD (gastroesophageal reflux disease)   . Hematuria, gross 09/15/2016  . History of kidney stones   . Hyperlipidemia   . Menopause    AGE 63  . Missed ab   . NSVD (normal spontaneous vaginal delivery)    X2  . Pneumonia   . Pre-diabetes   . Seasonal allergies   . Sleep apnea    possible sleep apnea, was using a CPAP at one time, but not now    Past Surgical History:  Procedure Laterality Date  . COLONOSCOPY    . DILATION AND CURETTAGE OF UTERUS    . FOOT SURGERY  2011   right  . FOOT SURGERY Right    spurs  . KNEE ARTHROSCOPY  1998, 2008   right  . VAGINAL HYSTERECTOMY  1997    Prior to Admission medications   Medication Sig Start Date End Date Taking? Authorizing Provider  Cholecalciferol (VITAMIN D3) 50 MCG (2000 UT) TABS Take 2,000 Units by mouth every evening.   Yes [provider]  clonazePAM (KLONOPIN) 1 MG tablet Take 0.5-1 mg by mouth 2 (two) times daily as needed for anxiety.    Yes [provider]  DULoxetine (CYMBALTA) 60 MG capsule Take 60 mg by mouth daily.   Yes [provider]  Melatonin 10 MG SUBL Place 10 mg under the tongue.   Yes [provider]  metFORMIN (GLUCOPHAGE) 500 MG tablet Take 500 mg by mouth 2 (two) times daily. 04/10/19  Yes [provider]  metoprolol succinate (TOPROL-XL) 50 MG 24 hr tablet metoprolol succinate ER 50 mg tablet,extended  release 24 hr  TAKE 1 TABLET BY MOUTH EVERY DAY   Yes [provider]  pantoprazole (PROTONIX) 40 MG tablet TAKE 1 TABLET DAILY Patient taking differently: Take 40 mg by mouth daily.  03/10/16  Yes Pyrtle, Lajuan Lines, MD  rosuvastatin (CRESTOR) 40 MG tablet Take 40 mg by mouth at bedtime.    Yes [provider]   Allergies  Allergen Reactions  . Morphine And Related Itching  . Sulfa Antibiotics Rash    Mild; has taken since and been ok with it    Social History   Tobacco Use  . Smoking status: Former Smoker    Quit date: 10/20/2010    Years since quitting: 8.5  . Smokeless tobacco: Former Systems developer    Quit date: 02/19/1984  Substance Use Topics  . Alcohol use: Yes    Alcohol/week: 2.0 standard drinks    Types: 2 Cans of beer per week    Comment: weekends    Family History  Problem Relation Age of Onset  . Colon cancer Paternal Grandmother 52  . Diabetes Mother   . Diabetes Sister   . Cancer Father        prostate  . Breast cancer Paternal Aunt 69  . Esophageal cancer Neg Hx   .  Stomach cancer Neg Hx   . Rectal cancer Neg Hx      Review of Systems  Positive ROS: neg  All other systems have been reviewed and were otherwise negative with the exception of those mentioned in the HPI and as above.  Objective: Vital signs in last 24 hours: Temp:  [98.5 F (36.9 C)] 98.5 F (36.9 C) (02/15 0908) Pulse Rate:  [81] 81 (02/15 0908) Resp:  [18] 18 (02/15 0908) BP: (146)/(88) 146/88 (02/15 0908) SpO2:  [98 %] 98 % (02/15 0908) Weight:  [71.4 kg] 71.4 kg (02/15 0908)  General Appearance: Alert, cooperative, no distress, appears stated age Head: Normocephalic, without obvious abnormality, atraumatic Eyes: PERRL, conjunctiva/corneas clear, EOM's intact    Neck: Supple, symmetrical, trachea midline Back: Symmetric, no curvature, ROM normal, no CVA tenderness Lungs:  respirations unlabored Heart: Regular rate and rhythm Abdomen: Soft, non-tender Extremities:  Extremities normal, atraumatic, no cyanosis or edema Pulses: 2+ and symmetric all extremities Skin: Skin color, texture, turgor normal, no rashes or lesions  NEUROLOGIC:   Mental status: Alert and oriented x4,  no aphasia, good attention span, fund of knowledge, and memory Motor Exam - grossly normal Sensory Exam - grossly normal Reflexes: 1+ Coordination - grossly normal Gait - grossly normal Balance - grossly normal Cranial Nerves: I: smell Not tested  II: visual acuity  OS: nl    OD: nl  II: visual fields Full to confrontation  II: pupils Equal, round, reactive to light  III,VII: ptosis None  III,IV,VI: extraocular muscles  Full ROM  V: mastication Normal  V: facial light touch sensation  Normal  V,VII: corneal reflex  Present  VII: facial muscle function - upper  Normal  VII: facial muscle function - lower Normal  VIII: hearing Not tested  IX: soft palate elevation  Normal  IX,X: gag reflex Present  XI: trapezius strength  5/5  XI: sternocleidomastoid strength 5/5  XI: neck flexion strength  5/5  XII: tongue strength  Normal    Data Review Lab Results  Component Value Date   WBC 7.2 04/27/2019   HGB 13.9 04/27/2019   HCT 42.8 04/27/2019   MCV 91.6 04/27/2019   PLT 333 04/27/2019   Lab Results  Component Value Date   NA 141 04/27/2019   K 4.1 04/27/2019   CL 106 04/27/2019   CO2 24 04/27/2019   BUN 15 04/27/2019   CREATININE 0.62 04/27/2019   GLUCOSE 113 (H) 04/27/2019   Lab Results  Component Value Date   INR 0.8 04/27/2019    Assessment/Plan:  Estimated body mass index is 25.03 kg/m as calculated from the following:   Height as of this encounter: 5' 6.5" (1.689 m).   Weight as of this encounter: 71.4 kg. Patient admitted for instrumented fusion L3-5. Patient has failed a reasonable attempt at conservative therapy.  I explained the condition and procedure to the patient and answered any questions.  Patient wishes to proceed with procedure as  planned. Understands risks/ benefits and typical outcomes of procedure.   [name] 05/01/2019 11:49 AM

## 2019-05-02 LAB — GLUCOSE, CAPILLARY
Glucose-Capillary: 136 mg/dL — ABNORMAL HIGH (ref 70–99)
Glucose-Capillary: 118 mg/dL — ABNORMAL HIGH (ref 70–99)
Glucose-Capillary: 115 mg/dL — ABNORMAL HIGH (ref 70–99)

## 2019-05-02 MED ORDER — DIPHENHYDRAMINE HCL 25 MG PO CAPS
25.0000 mg | ORAL_CAPSULE | Freq: Four times a day (QID) | ORAL | Status: DC | PRN
  Administered 2019-05-02 – 2019-05-03 (×4): 25 mg via ORAL
  Filled 2019-05-02 (×4): qty 1

## 2019-05-02 MED ORDER — OXYCODONE HCL 5 MG PO TABS
10.0000 mg | ORAL_TABLET | ORAL | Status: DC | PRN
  Administered 2019-05-02 – 2019-05-03 (×8): 10 mg via ORAL
  Filled 2019-05-02 (×8): qty 2

## 2019-05-02 MED ORDER — KETOROLAC TROMETHAMINE 30 MG/ML IJ SOLN
30.0000 mg | Freq: Four times a day (QID) | INTRAMUSCULAR | Status: AC
  Administered 2019-05-02 – 2019-05-03 (×3): 30 mg via INTRAVENOUS
  Filled 2019-05-02 (×3): qty 1

## 2019-05-02 MED ORDER — BISACODYL 5 MG PO TBEC
5.0000 mg | DELAYED_RELEASE_TABLET | Freq: Every day | ORAL | Status: DC | PRN
  Administered 2019-05-03: 5 mg via ORAL
  Filled 2019-05-02: qty 1

## 2019-05-02 NOTE — Care Management (Signed)
Consult for HH/DME. No HH recs or orders at this time. DME will be provided by unit staff through floorstock.

## 2019-05-02 NOTE — Progress Notes (Signed)
Patient ID: Breanna Palmer, female   DOB: [birth year], 59 y.o.   MRN: [id number] Subjective: Patient reports back pain, 9/10, no leg pain or NTW  Objective: Vital signs in last 24 hours: Temp:  [97.6 F (36.4 C)-98.5 F (36.9 C)] 97.9 F (36.6 C) (02/16 0751) Pulse Rate:  [81-98] 98 (02/16 0751) Resp:  [11-20] 16 (02/16 0751) BP: (98-146)/(52-88) 98/56 (02/16 0751) SpO2:  [93 %-98 %] 96 % (02/16 0751) Weight:  [71.4 kg] 71.4 kg (02/15 0908)  Intake/Output from previous day: 02/15 0701 - 02/16 0700 In: 1303 [I.V.:1303] Out: 560 [Urine:360; Blood:200] Intake/Output this shift: No intake/output data recorded.  Neurologic: Grossly normal  Lab Results: Lab Results  Component Value Date   WBC 7.2 04/27/2019   HGB 13.9 04/27/2019   HCT 42.8 04/27/2019   MCV 91.6 04/27/2019   PLT 333 04/27/2019   Lab Results  Component Value Date   INR 0.8 04/27/2019   BMET Lab Results  Component Value Date   NA 141 04/27/2019   K 4.1 04/27/2019   CL 106 04/27/2019   CO2 24 04/27/2019   GLUCOSE 113 (H) 04/27/2019   BUN 15 04/27/2019   CREATININE 0.62 04/27/2019   CALCIUM 9.1 04/27/2019    Studies/Results: DG Lumbar Spine 2-3 Views  Result Date: 05/01/2019 CLINICAL DATA:  Surgery, elective. Additional history provided: TLIF L3-L5, fluoro time 1 minutes, 15 seconds EXAM: LUMBAR SPINE - 2-3 VIEW; DG C-ARM 1-60 MIN COMPARISON:  Lumbar spine radiographs 03/28/2019 FINDINGS: Two intraoperative images are submitted. There is a posterior spinal fusion construct spanning the L3-L5 levels consisting of paired pedicle screws and vertical interconnecting rods. No significant spondylolisthesis at these levels. Facet arthrosis. IMPRESSION: Two intraoperative images of the lumbar spine, as described. Electronically Signed   By: Kellie Simmering DO   On: 05/01/2019 15:56   DG C-Arm 1-60 Min  Result Date: 05/01/2019 CLINICAL DATA:  Surgery, elective. Additional history provided: TLIF L3-L5, fluoro time 1  minutes, 15 seconds EXAM: LUMBAR SPINE - 2-3 VIEW; DG C-ARM 1-60 MIN COMPARISON:  Lumbar spine radiographs 03/28/2019 FINDINGS: Two intraoperative images are submitted. There is a posterior spinal fusion construct spanning the L3-L5 levels consisting of paired pedicle screws and vertical interconnecting rods. No significant spondylolisthesis at these levels. Facet arthrosis. IMPRESSION: Two intraoperative images of the lumbar spine, as described. Electronically Signed   By: Kellie Simmering DO   On: 05/01/2019 15:56    Assessment/Plan: Mobilize, pain control  Estimated body mass index is 25.03 kg/m as calculated from the following:   Height as of this encounter: 5' 6.5" (1.689 m).   Weight as of this encounter: 71.4 kg.    LOS: 1 day    Breanna Palmer 05/02/2019, 8:09 AM    '

## 2019-05-02 NOTE — Progress Notes (Signed)
Occupational Therapy Evaluation Patient Details Name: Breanna Palmer MRN: [id number] DOB: Aug 21, 1960 Today's Date: 05/02/2019    History of Present Illness Pt is a 59 y/o female s/p L3-5 posterior fixation. PMH includes fibromyalgia, pre-diabetes, and sleep apnea.    Clinical Impression   PTA, pt was independent with ADL/IADL and functional mobility. She is currently limited by pain. Pt requires minA with LB dressing, therapist educated pt on use of AE to assist with dressing, pt reports she will have family assist at this time. Pt required minguard for functional mobility with use of RW for stability and pain management. Pt required min cues for adherence to back precautions throughout today's session. She will benefit from acute follow up to address toilet transfers, tub transfers, and adherence to precautions during ADL completion. Feel pt will continue to progress when pain is managed and with assistance from her family at d/c without need for followup OT. Will continue to follow acutely.     Follow Up Recommendations  No OT follow up    Equipment Recommendations  None recommended by OT    Recommendations for Other Services       Precautions / Restrictions Precautions Precautions: Back Precaution Booklet Issued: Yes (comment) Precaution Comments: Reviewed back precautions with pt during functional mobility Required Braces or Orthoses: Spinal Brace Spinal Brace: Lumbar corset Restrictions Weight Bearing Restrictions: No      Mobility Bed Mobility Overal bed mobility: Needs Assistance Bed Mobility: Rolling;Sit to Sidelying Rolling: Modified independent (Device/Increase time)       Sit to sidelying: Supervision General bed mobility comments: increased time due to pain, pt demonstrated proper log roll technique with use of rails  Transfers Overall transfer level: Needs assistance Equipment used: Rolling walker (2 wheeled) Transfers: Sit to/from Stand Sit to Stand: Min  guard         General transfer comment: minguard for safety as pt powered into standing    Balance Overall balance assessment: Needs assistance Sitting-balance support: No upper extremity supported;Feet supported Sitting balance-Leahy Scale: Good     Standing balance support: During functional activity;No upper extremity supported Standing balance-Leahy Scale: Fair Standing balance comment: Able to maintain static standing without UE support (washing hands at sink level)                           ADL either performed or assessed with clinical judgement   ADL Overall ADL's : Needs assistance/impaired Eating/Feeding: Set up;Sitting   Grooming: Minimal assistance;Standing Grooming Details (indicate cue type and reason): cues for adherence to precautions Upper Body Bathing: Min guard;Sitting   Lower Body Bathing: Min guard;Sit to/from stand   Upper Body Dressing : Min guard;Sitting Upper Body Dressing Details (indicate cue type and reason): pt doffed gown and donned personal clothing Lower Body Dressing: Minimal assistance;Sit to/from stand Lower Body Dressing Details (indicate cue type and reason): minA to don items on feet Toilet Transfer: Min Marine scientist Details (indicate cue type and reason): with heightened toilet Toileting- Clothing Manipulation and Hygiene: Min guard;Sit to/from stand       Functional mobility during ADLs: Min guard;Minimal assistance;Rolling walker General ADL Comments: minguard for stability and safety;minA for adherence to back precautions;pt limited by pain;demonstrated use of AE to assist with LB dressing, pt reports she will have family assist at this time but verbalized appreciation for education and took pictures with her phone for future reference if needed     Vision Baseline Vision/History: Wears  glasses Wears Glasses: Reading only Patient Visual Report: No change from baseline       Perception      Praxis      Pertinent Vitals/Pain Pain Assessment: Faces Faces Pain Scale: Hurts even more Pain Location: back Pain Descriptors / Indicators: Aching;Operative site guarding Pain Intervention(s): Limited activity within patient's tolerance;Monitored during session;Repositioned     Hand Dominance Right   Extremity/Trunk Assessment Upper Extremity Assessment Upper Extremity Assessment: Generalized weakness   Lower Extremity Assessment Lower Extremity Assessment: Generalized weakness   Cervical / Trunk Assessment Cervical / Trunk Assessment: Other exceptions Cervical / Trunk Exceptions: s/p lumbar surgery   Communication Communication Communication: No difficulties   Cognition Arousal/Alertness: Awake/alert Behavior During Therapy: WFL for tasks assessed/performed Overall Cognitive Status: Within Functional Limits for tasks assessed                                 General Comments: pt required min cues for adherence to back precautions with ADL completion   General Comments  pt requested use of RW for pain management    Exercises     Shoulder Instructions      Home Living Family/patient expects to be discharged to:: Private residence Living Arrangements: Spouse/significant other Available Help at Discharge: Family Type of Home: House Home Access: Stairs to enter Technical brewer of Steps: 5 Entrance Stairs-Rails: None Home Layout: One level     Bathroom Shower/Tub: Teacher, early years/pre: Handicapped height     Home Equipment: None          Prior Functioning/Environment Level of Independence: Independent                 OT Problem List: Decreased activity tolerance;Impaired balance (sitting and/or standing);Decreased safety awareness;Decreased knowledge of precautions;Decreased knowledge of use of DME or AE;Pain      OT Treatment/Interventions: Self-care/ADL training;Therapeutic exercise;DME and/or AE  instruction;Therapeutic activities;Patient/family education;Balance training    OT Goals(Current goals can be found in the care plan section) Acute Rehab OT Goals Patient Stated Goal: Pain control OT Goal Formulation: With patient Time For Goal Achievement: 05/09/19 Potential to Achieve Goals: Good ADL Goals Pt Will Transfer to Toilet: with modified independence;ambulating Pt Will Perform Toileting - Clothing Manipulation and hygiene: with modified independence;sit to/from stand Pt Will Perform Tub/Shower Transfer: with modified independence;Tub transfer Additional ADL Goal #1: Pt will independently demonstrate adherence to 3/3 back precautions during ADL/IADL completion.  OT Frequency: Min 2X/week   Barriers to D/C:            Co-evaluation              AM-PAC OT "6 Clicks" Daily Activity     Outcome Measure Help from another person eating meals?: A Little Help from another person taking care of personal grooming?: A Little Help from another person toileting, which includes using toliet, bedpan, or urinal?: A Little Help from another person bathing (including washing, rinsing, drying)?: A Little Help from another person to put on and taking off regular upper body clothing?: A Little Help from another person to put on and taking off regular lower body clothing?: A Little 6 Click Score: 18   End of Session Equipment Utilized During Treatment: Rolling walker Nurse Communication: Mobility status  Activity Tolerance: Patient tolerated treatment well;Patient limited by pain Patient left: in bed;with call bell/phone within reach  OT Visit Diagnosis: Other abnormalities of gait and mobility (R26.89);Unsteadiness on  feet (R26.81);Muscle weakness (generalized) (M62.81);Pain Pain - part of body: (back)                Time: [id number] OT Time Calculation (min): 34 min Charges:  OT General Charges $OT Visit: 1 Visit OT Evaluation $OT Eval Low Complexity: 1 Low OT  Treatments $Self Care/Home Management : 8-22 mins  Dorinda Hill OTR/L Acute Rehabilitation Services Office: [place] 05/02/2019, 9:59 AM

## 2019-05-02 NOTE — Progress Notes (Signed)
Physical Therapy Treatment Patient Details Name: Breanna Palmer MRN: [id number] DOB: 1960-11-09 Today's Date: 05/02/2019    History of Present Illness Pt is a 59 y/o female s/p L3-5 posterior fixation. PMH includes fibromyalgia, pre-diabetes, and sleep apnea.     PT Comments    Pt progressing well with post-op mobility. She was able to demonstrate transfers and ambulation with gross supervision for safety, and stair negotiation with heavy min assist as she does not have railings at home. Pain was increased throughout session however RW appeared to be helpful to take some pressure off of her low back. Education was reviewed on precautions, brace application/wearing schedule, appropriate activity progression, positioning recommendations and car transfer. Will continue to follow.     Follow Up Recommendations  No PT follow up     Equipment Recommendations  None recommended by PT    Recommendations for Other Services       Precautions / Restrictions Precautions Precautions: Back Precaution Booklet Issued: Yes (comment) Precaution Comments: Reviewed back precautions with pt during functional mobility Restrictions Weight Bearing Restrictions: No    Mobility  Bed Mobility Overal bed mobility: Needs Assistance Bed Mobility: Rolling;Sidelying to Sit;Sit to Sidelying Rolling: Modified independent (Device/Increase time) Sidelying to sit: Supervision       General bed mobility comments: Increased time due to pain. Pt demonstrated proper log roll technique with use of rails and HOB slightly elevated.   Transfers Overall transfer level: Needs assistance Equipment used: 1 person hand held assist Transfers: Sit to/from Stand Sit to Stand: Min guard         General transfer comment: Close hands on guarding for safety as pt powered up to full stand.   Ambulation/Gait Ambulation/Gait assistance: Supervision Gait Distance (Feet): 200 Feet Assistive device: None;Rolling walker (2  wheeled) Gait Pattern/deviations: Step-through pattern;Decreased stride length Gait velocity: Decreased Gait velocity interpretation: <1.8 ft/sec, indicate of risk for recurrent falls General Gait Details: Initially without RW. Pt was able to ambulate but was guarded and reaching out for wall support. With RW, pt progressed to supervision for safety and appeared more comfortable and confident. Pt still taking short standing rest breaks due to reported muscle spasms.    Stairs Stairs: Yes Stairs assistance: Min assist Stair Management: No rails;Step to pattern;Forwards Number of Stairs: 3 General stair comments: HHA on the L without rails to simulate home environment. Pt requiring heavy min assist for balance support and boost to next step up   Wheelchair Mobility    Modified Rankin (Stroke Patients Only)       Balance Overall balance assessment: Needs assistance Sitting-balance support: No upper extremity supported;Feet supported Sitting balance-Leahy Scale: Good     Standing balance support: Single extremity supported;During functional activity;No upper extremity supported Standing balance-Leahy Scale: Fair Standing balance comment: Able to maintain static standing without UE support                             Cognition Arousal/Alertness: Awake/alert Behavior During Therapy: WFL for tasks assessed/performed Overall Cognitive Status: Within Functional Limits for tasks assessed                                        Exercises      General Comments        Pertinent Vitals/Pain Pain Assessment: Faces Faces Pain Scale: Hurts little more Pain Location:  back Pain Descriptors / Indicators: Aching;Operative site guarding Pain Intervention(s): Limited activity within patient's tolerance;Monitored during session;Repositioned    Home Living                      Prior Function            PT Goals (current goals can now be found in  the care plan section) Acute Rehab PT Goals Patient Stated Goal: Pain control PT Goal Formulation: With patient Time For Goal Achievement: 05/15/19 Potential to Achieve Goals: Good Progress towards PT goals: Progressing toward goals    Frequency    Min 5X/week      PT Plan Current plan remains appropriate    Co-evaluation              AM-PAC PT "6 Clicks" Mobility   Outcome Measure  Help needed turning from your back to your side while in a flat bed without using bedrails?: None Help needed moving from lying on your back to sitting on the side of a flat bed without using bedrails?: None Help needed moving to and from a bed to a chair (including a wheelchair)?: A Little Help needed standing up from a chair using your arms (e.g., wheelchair or bedside chair)?: A Little Help needed to walk in hospital room?: A Little Help needed climbing 3-5 steps with a railing? : A Little 6 Click Score: 20    End of Session Equipment Utilized During Treatment: Gait belt Activity Tolerance: Patient tolerated treatment well Patient left: in bed;with call bell/phone within reach;with family/visitor present Nurse Communication: Mobility status PT Visit Diagnosis: Muscle weakness (generalized) (M62.81);Pain Pain - part of body: (back)     Time: [id number] PT Time Calculation (min) (ACUTE ONLY): 38 min  Charges:  $Gait Training: 38-52 mins                     Rolinda Roan, PT, DPT Acute Rehabilitation Services Pager: [phone] Office: [phone]    Thelma Comp 05/02/2019, 9:26 AM

## 2019-05-03 ENCOUNTER — Encounter: Payer: Self-pay | Admitting: *Deleted

## 2019-05-03 LAB — GLUCOSE, CAPILLARY: Glucose-Capillary: 109 mg/dL — ABNORMAL HIGH (ref 70–99)

## 2019-05-03 MED ORDER — OXYCODONE HCL 10 MG PO TABS: 10.0000 mg | ORAL_TABLET | ORAL | 0 refills | Status: DC | PRN

## 2019-05-03 MED ORDER — METHOCARBAMOL 500 MG PO TABS: 500.0000 mg | ORAL_TABLET | Freq: Four times a day (QID) | ORAL | 1 refills | Status: DC | PRN

## 2019-05-03 NOTE — Progress Notes (Addendum)
Physical Therapy Treatment Patient Details Name: Breanna Palmer MRN: [id number] DOB: 1960/12/30 Today's Date: 05/03/2019    History of Present Illness Pt is a 59 y/o female s/p L3-5 posterior fixation. PMH includes fibromyalgia, pre-diabetes, and sleep apnea.     PT Comments    Pt progressing well with post-op mobility. She was able to demonstrate transfers and ambulation with gross modified independence. Pt overall mobilizing well today with RW - pt has one at home she can use. We reviewed education on precautions, brace application/wearing schedule, appropriate activity progression, positioning recommendations and car transfer. Pt anticipates d/c home today but we will continue to follow until then.     Follow Up Recommendations  No PT follow up     Equipment Recommendations  None recommended by PT    Recommendations for Other Services       Precautions / Restrictions Precautions Precautions: Back Precaution Booklet Issued: Yes (comment) Precaution Comments: Reviewed back precautions with pt during functional mobility Required Braces or Orthoses: Spinal Brace Spinal Brace: Lumbar corset Restrictions Weight Bearing Restrictions: No    Mobility  Bed Mobility               General bed mobility comments: Pt sitting up EOB when PT arrived.   Transfers Overall transfer level: Modified independent Equipment used: Rolling walker (2 wheeled) Transfers: Sit to/from Stand           General transfer comment: No assist. Pt demonstrated proper hand placement on seated surface for safety.   Ambulation/Gait Ambulation/Gait assistance: Modified independent (Device/Increase time) Gait Distance (Feet): 200 Feet Assistive device: Rolling walker (2 wheeled) Gait Pattern/deviations: Step-through pattern;Decreased stride length Gait velocity: Decreased Gait velocity interpretation: <1.8 ft/sec, indicate of risk for recurrent falls General Gait Details: Slow and guarded but  generally steady and safe with RW for support. Pt with fairly good posture throughout - min cues to correct and pt able to maintain corrective changes.    Stairs         General stair comments: Reviewed stair sequencing verbally. Pt reports feeling comfortable and declines practicing again.    Wheelchair Mobility    Modified Rankin (Stroke Patients Only)       Balance Overall balance assessment: Needs assistance Sitting-balance support: No upper extremity supported;Feet supported Sitting balance-Leahy Scale: Good     Standing balance support: During functional activity;No upper extremity supported Standing balance-Leahy Scale: Fair                              Cognition Arousal/Alertness: Awake/alert Behavior During Therapy: WFL for tasks assessed/performed Overall Cognitive Status: Within Functional Limits for tasks assessed                                        Exercises      General Comments        Pertinent Vitals/Pain Pain Assessment: Faces Faces Pain Scale: Hurts even more Pain Location: back Pain Descriptors / Indicators: Aching;Operative site guarding Pain Intervention(s): Limited activity within patient's tolerance;Monitored during session;Repositioned    Home Living                      Prior Function            PT Goals (current goals can now be found in the care plan section) Acute Rehab PT  Goals Patient Stated Goal: Pain control PT Goal Formulation: With patient Time For Goal Achievement: 05/15/19 Potential to Achieve Goals: Good Progress towards PT goals: Progressing toward goals    Frequency    Min 5X/week      PT Plan Current plan remains appropriate    Co-evaluation              AM-PAC PT "6 Clicks" Mobility   Outcome Measure  Help needed turning from your back to your side while in a flat bed without using bedrails?: None Help needed moving from lying on your back to sitting on  the side of a flat bed without using bedrails?: None Help needed moving to and from a bed to a chair (including a wheelchair)?: None Help needed standing up from a chair using your arms (e.g., wheelchair or bedside chair)?: None Help needed to walk in hospital room?: None Help needed climbing 3-5 steps with a railing? : A Little 6 Click Score: 23    End of Session Equipment Utilized During Treatment: Gait belt Activity Tolerance: Patient tolerated treatment well Patient left: (Sitting EOB with OT present) Nurse Communication: Mobility status PT Visit Diagnosis: Muscle weakness (generalized) (M62.81);Pain Pain - part of body: (back)     Time: [id number] PT Time Calculation (min) (ACUTE ONLY): 17 min  Charges:  $Gait Training: 8-22 mins                     Breanna Palmer, PT, DPT Acute Rehabilitation Services Pager: [phone] Office: [phone]    Breanna Palmer 05/03/2019, 8:33 AM

## 2019-05-03 NOTE — Progress Notes (Signed)
Occupational Therapy Treatment Patient Details Name: OLLA SERVISS MRN: [id number] DOB: 03-Oct-1960 Today's Date: 05/03/2019    History of present illness Pt is a 59 y/o female s/p L3-5 posterior fixation. PMH includes fibromyalgia, pre-diabetes, and sleep apnea.    OT comments  Patient progressing well.  Able to recall back precautions with independence, min cueing for adherence functionally. Min assist for brace mgmt required. Completing figure 4 technique with supervision and increased time. Simulated tub transfers using reverse technique with RW with min guard.  She reports plan to purchase shower chair after dc.     Follow Up Recommendations  No OT follow up    Equipment Recommendations  None recommended by OT(reports can get a shower chair )    Recommendations for Other Services      Precautions / Restrictions Precautions Precautions: Back Precaution Booklet Issued: Yes (comment) Precaution Comments: Reviewed back precautions with pt during functional mobility Required Braces or Orthoses: Spinal Brace Spinal Brace: Lumbar corset;Applied in sitting position Restrictions Weight Bearing Restrictions: No       Mobility Bed Mobility Overal bed mobility: Needs Assistance Bed Mobility: Rolling;Sit to Sidelying Rolling: Modified independent (Device/Increase time)       Sit to sidelying: Supervision General bed mobility comments: for safety, back precautions   Transfers Overall transfer level: Needs assistance Equipment used: Rolling walker (2 wheeled) Transfers: Sit to/from Stand Sit to Stand: Supervision         General transfer comment: for safety, cueing for hand placement     Balance Overall balance assessment: Needs assistance Sitting-balance support: No upper extremity supported;Feet supported Sitting balance-Leahy Scale: Good     Standing balance support: No upper extremity supported;Bilateral upper extremity supported;During functional  activity Standing balance-Leahy Scale: Fair Standing balance comment: B UE support dynamically                           ADL either performed or assessed with clinical judgement   ADL Overall ADL's : Needs assistance/impaired             Lower Body Bathing: Supervison/ safety;Sit to/from stand Lower Body Bathing Details (indicate cue type and reason): reviewed technique seated, able to reach feet using figure 4 today  Upper Body Dressing : Minimal assistance;Sitting Upper Body Dressing Details (indicate cue type and reason): min assist for brace mgmt  Lower Body Dressing: Supervision/safety;Adhering to back precautions;Sit to/from stand Lower Body Dressing Details (indicate cue type and reason): using figure 4 technique with seated and cueing given increased time  Toilet Transfer: Supervision/safety;Ambulation;RW Toilet Transfer Details (indicate cue type and reason): simulated in room      Tub/ Shower Transfer: Min guard;Tub transfer;Ambulation;Rolling walker Tub/Shower Transfer Details (indicate cue type and reason): simulated technique using RW and reverse step technique given min guard; pt verbalized understanding and reports family can assist at dc Functional mobility during ADLs: Supervision/safety;Min guard;Rolling walker General ADL Comments: progressing well, able to complete figure 4 technique now with increased time, min cueing for back precautions functionally     Vision       Perception     Praxis      Cognition Arousal/Alertness: Awake/alert Behavior During Therapy: WFL for tasks assessed/performed Overall Cognitive Status: Within Functional Limits for tasks assessed  Exercises     Shoulder Instructions       General Comments      Pertinent Vitals/ Pain       Pain Assessment: Faces Faces Pain Scale: Hurts little more Pain Location: back Pain Descriptors / Indicators:  Aching;Operative site guarding Pain Intervention(s): Monitored during session;Repositioned  Home Living                                          Prior Functioning/Environment              Frequency  Min 2X/week        Progress Toward Goals  OT Goals(current goals can now be found in the care plan section)  Progress towards OT goals: Progressing toward goals  Acute Rehab OT Goals Patient Stated Goal: home today  OT Goal Formulation: With patient  Plan Discharge plan remains appropriate;Frequency remains appropriate    Co-evaluation                 AM-PAC OT "6 Clicks" Daily Activity     Outcome Measure   Help from another person eating meals?: None Help from another person taking care of personal grooming?: A Little Help from another person toileting, which includes using toliet, bedpan, or urinal?: A Little Help from another person bathing (including washing, rinsing, drying)?: A Little Help from another person to put on and taking off regular upper body clothing?: A Little Help from another person to put on and taking off regular lower body clothing?: A Little 6 Click Score: 19    End of Session Equipment Utilized During Treatment: Rolling walker  OT Visit Diagnosis: Other abnormalities of gait and mobility (R26.89);Unsteadiness on feet (R26.81);Muscle weakness (generalized) (M62.81);Pain Pain - part of body: (back)   Activity Tolerance Patient tolerated treatment well;Patient limited by pain   Patient Left in bed;with call bell/phone within reach   Nurse Communication Mobility status        Time: [id number] OT Time Calculation (min): 24 min  Charges: OT General Charges $OT Visit: 1 Visit OT Treatments $Self Care/Home Management : 23-37 mins  [facility] Pager [phone] Office [place] 05/03/2019, 1:32 PM

## 2019-05-03 NOTE — Plan of Care (Signed)
Pt doing well. Pt and husband given D/C instructions with verbal understanding. Rx's were sent to the pharmacy by MD. Pt's incision is clean and dry with no sign of infection. Pt's IV was removed prior to D/C. Pt D/C'd home via wheelchair per MD order. Pt is stable @ D/C and has no other needs at this time. Pearley Millington, RN  

## 2019-05-03 NOTE — Discharge Summary (Signed)
Physician Discharge Summary  Patient ID: Breanna Palmer MRN: [id number] DOB/AGE: 1960-07-20 59 y.o.  Admit date: 05/01/2019 Discharge date: 05/03/2019  Admission Diagnoses: lumbar instability    Discharge Diagnoses: same   Discharged Condition: good  Hospital Course: The patient was admitted on 05/01/2019 and taken to the operating room where the patient underwent lumbar fusion. The patient tolerated the procedure well and was taken to the recovery room and then to the floor in stable condition. The hospital course was routine. There were no complications. The wound remained clean dry and intact. Pt had appropriate back soreness. No complaints of leg pain or new N/T/W. The patient remained afebrile with stable vital signs, and tolerated a regular diet. The patient continued to increase activities, and pain was well controlled with oral pain medications.   Consults: None  Significant Diagnostic Studies:  Results for orders placed or performed during the hospital encounter of 05/01/19  Glucose, capillary  Result Value Ref Range   Glucose-Capillary 120 (H) 70 - 99 mg/dL  Glucose, capillary  Result Value Ref Range   Glucose-Capillary 96 70 - 99 mg/dL  Glucose, capillary  Result Value Ref Range   Glucose-Capillary 110 (H) 70 - 99 mg/dL   Comment 1 Notify RN    Comment 2 Document in Chart   Glucose, capillary  Result Value Ref Range   Glucose-Capillary 128 (H) 70 - 99 mg/dL  Glucose, capillary  Result Value Ref Range   Glucose-Capillary 223 (H) 70 - 99 mg/dL   Comment 1 Notify RN    Comment 2 Document in Chart   Glucose, capillary  Result Value Ref Range   Glucose-Capillary 136 (H) 70 - 99 mg/dL   Comment 1 Notify RN    Comment 2 Document in Chart   Glucose, capillary  Result Value Ref Range   Glucose-Capillary 118 (H) 70 - 99 mg/dL   Comment 1 Notify RN    Comment 2 Document in Chart   Glucose, capillary  Result Value Ref Range   Glucose-Capillary 115 (H) 70 - 99 mg/dL    Comment 1 Notify RN    Comment 2 Document in Chart   Glucose, capillary  Result Value Ref Range   Glucose-Capillary 109 (H) 70 - 99 mg/dL   Comment 1 Notify RN    Comment 2 Document in Chart     Chest 2 View  Result Date: 04/27/2019 CLINICAL DATA:  Preop lumbar surgery. EXAM: CHEST - 2 VIEW COMPARISON:  04/08/2010 FINDINGS: The cardiomediastinal contours are normal. The lungs are clear. Pulmonary vasculature is normal. No consolidation, pleural effusion, or pneumothorax. No acute osseous abnormalities are seen. IMPRESSION: Unremarkable radiographs of the chest. Electronically Signed   By: Keith Rake M.D.   On: 04/27/2019 15:27   DG Lumbar Spine 2-3 Views  Result Date: 05/01/2019 CLINICAL DATA:  Surgery, elective. Additional history provided: TLIF L3-L5, fluoro time 1 minutes, 15 seconds EXAM: LUMBAR SPINE - 2-3 VIEW; DG C-ARM 1-60 MIN COMPARISON:  Lumbar spine radiographs 03/28/2019 FINDINGS: Two intraoperative images are submitted. There is a posterior spinal fusion construct spanning the L3-L5 levels consisting of paired pedicle screws and vertical interconnecting rods. No significant spondylolisthesis at these levels. Facet arthrosis. IMPRESSION: Two intraoperative images of the lumbar spine, as described. Electronically Signed   By: Kellie Simmering DO   On: 05/01/2019 15:56   DG C-Arm 1-60 Min  Result Date: 05/01/2019 CLINICAL DATA:  Surgery, elective. Additional history provided: TLIF L3-L5, fluoro time 1 minutes, 15 seconds EXAM: LUMBAR  SPINE - 2-3 VIEW; DG C-ARM 1-60 MIN COMPARISON:  Lumbar spine radiographs 03/28/2019 FINDINGS: Two intraoperative images are submitted. There is a posterior spinal fusion construct spanning the L3-L5 levels consisting of paired pedicle screws and vertical interconnecting rods. No significant spondylolisthesis at these levels. Facet arthrosis. IMPRESSION: Two intraoperative images of the lumbar spine, as described. Electronically Signed   By: Kellie Simmering DO   On: 05/01/2019 15:56    Antibiotics:  Anti-infectives (From admission, onward)   Start     Dose/Rate Route Frequency Ordered Stop   05/01/19 2000  ceFAZolin (ANCEF) IVPB 2g/100 mL premix     2 g 200 mL/hr over 30 Minutes Intravenous Every 8 hours 05/01/19 1627 05/02/19 0448   05/01/19 1240  bacitracin 50,000 Units in sodium chloride 0.9 % 500 mL irrigation  Status:  Discontinued       As needed 05/01/19 1240 05/01/19 1544   05/01/19 0900  ceFAZolin (ANCEF) IVPB 2g/100 mL premix     2 g 200 mL/hr over 30 Minutes Intravenous On call to O.R. 05/01/19 0855 05/01/19 1310      Discharge Exam: Blood pressure 106/65, pulse (!) 114, temperature 98.6 F (37 C), temperature source Oral, resp. rate 16, height 5' 6.5" (1.689 m), weight 71.4 kg, SpO2 93 %. Neurologic: Grossly normal dressing dry  Discharge Medications:   Allergies as of 05/03/2019      Reactions   Morphine And Related Itching   Sulfa Antibiotics Rash   Mild; has taken since and been ok with it      Medication List    TAKE these medications   clonazePAM 1 MG tablet Commonly known as: KLONOPIN Take 0.5-1 mg by mouth 2 (two) times daily as needed for anxiety.   DULoxetine 60 MG capsule Commonly known as: CYMBALTA Take 60 mg by mouth daily.   Melatonin 10 MG Subl Place 10 mg under the tongue.   metFORMIN 500 MG tablet Commonly known as: GLUCOPHAGE Take 500 mg by mouth 2 (two) times daily.   methocarbamol 500 MG tablet Commonly known as: ROBAXIN Take 1 tablet (500 mg total) by mouth every 6 (six) hours as needed for muscle spasms.   metoprolol succinate 50 MG 24 hr tablet Commonly known as: TOPROL-XL metoprolol succinate ER 50 mg tablet,extended release 24 hr  TAKE 1 TABLET BY MOUTH EVERY DAY   Oxycodone HCl 10 MG Tabs Take 1 tablet (10 mg total) by mouth every 4 (four) hours as needed for moderate pain ((score 4 to 6)).   pantoprazole 40 MG tablet Commonly known as: PROTONIX TAKE 1 TABLET  DAILY   rosuvastatin 40 MG tablet Commonly known as: CRESTOR Take 40 mg by mouth at bedtime.   Vitamin D3 50 MCG (2000 UT) Tabs Take 2,000 Units by mouth every evening.            Durable Medical Equipment  (From admission, onward)         Start     Ordered   05/01/19 1628  DME Walker rolling  Once    Question:  Patient needs a walker to treat with the following condition  Answer:  S/P lumbar fusion   05/01/19 1627   05/01/19 1628  DME 3 n 1  Once     05/01/19 1627          Disposition: home   Final Dx: lumbar fusion L3-5  Discharge Instructions    Call MD for:  difficulty breathing, headache or visual disturbances  Complete by: As directed    Call MD for:  persistant nausea and vomiting   Complete by: As directed    Call MD for:  redness, tenderness, or signs of infection (pain, swelling, redness, odor or green/yellow discharge around incision site)   Complete by: As directed    Call MD for:  severe uncontrolled pain   Complete by: As directed    Call MD for:  temperature >100.4   Complete by: As directed    Diet - low sodium heart healthy   Complete by: As directed    Increase activity slowly   Complete by: As directed    Remove dressing in 48 hours   Complete by: As directed          Signed: [name] 05/03/2019, 11:26 AM

## 2019-05-07 ENCOUNTER — Encounter (INDEPENDENT_AMBULATORY_CARE_PROVIDER_SITE_OTHER): Payer: Self-pay

## 2019-09-29 ENCOUNTER — Other Ambulatory Visit: Payer: Self-pay | Admitting: Neurological Surgery

## 2019-09-29 DIAGNOSIS — G8929 Other chronic pain: Secondary | ICD-10-CM

## 2019-10-08 HISTORY — PX: EXPLORATORY LAPAROTOMY: SUR591

## 2019-10-13 ENCOUNTER — Telehealth: Payer: Self-pay | Admitting: Internal Medicine

## 2019-10-13 NOTE — Telephone Encounter (Signed)
Patient states she has been in [facility] from Sunday until yesterday. She started feeling bad on 7/25. Had fever, chills, SOB and nausea. Went to Urgent care and was transported via EMS to ED when EKG was abnormal. MI was ruled out. WBC were elevated. CT of abdomen showed a perforation in bowel. Surgery was performed and per patient no perforation was found in stomach or small intestines. EGD was performed and was normal also.(surgeon- Dr. Tye Savoy with [facility]) She had an NG tube for 3 days and ate for the first time yesterday. Her Pantoprazole has been increased to BID. She has a f/u Ov with her GP on 10/24/19. She is trying to obtain all her medical records next week. Wants Dr. Hilarie Fredrickson to know what has happened and would like an OV with him in4-6 weeks if possible.

## 2019-10-13 NOTE — Telephone Encounter (Signed)
Ok, thanks [employer], please look for an appt in 4-6 weeks and put on cancellation list Thanks

## 2019-10-13 NOTE — Telephone Encounter (Signed)
Patient placed on cancellation list

## 2019-10-17 ENCOUNTER — Telehealth: Payer: Self-pay | Admitting: Internal Medicine

## 2019-10-17 NOTE — Telephone Encounter (Signed)
Pt scheduled for 9/27@2 :30pm and on cancellation list. Pt wanted to at least have something scheduled. She is working on getting her records to have for [place] to review.

## 2019-10-20 ENCOUNTER — Other Ambulatory Visit: Payer: Self-pay

## 2019-10-20 ENCOUNTER — Ambulatory Visit: Payer: BC Managed Care – PPO | Admitting: Cardiology

## 2019-10-20 ENCOUNTER — Encounter: Payer: Self-pay | Admitting: Cardiology

## 2019-10-20 VITALS — BP 128/70 | HR 97 | Resp 16 | Ht 66.5 in | Wt 143.0 lb

## 2019-10-20 DIAGNOSIS — I447 Left bundle-branch block, unspecified: Secondary | ICD-10-CM

## 2019-10-20 DIAGNOSIS — E119 Type 2 diabetes mellitus without complications: Secondary | ICD-10-CM

## 2019-10-20 DIAGNOSIS — R0789 Other chest pain: Secondary | ICD-10-CM

## 2019-10-20 DIAGNOSIS — E7800 Pure hypercholesterolemia, unspecified: Secondary | ICD-10-CM

## 2019-10-20 DIAGNOSIS — R002 Palpitations: Secondary | ICD-10-CM

## 2019-10-20 MED ORDER — ACEBUTOLOL HCL 200 MG PO CAPS: 200.0000 mg | ORAL_CAPSULE | Freq: Two times a day (BID) | ORAL | 2 refills | Status: DC

## 2019-10-20 MED ORDER — ASPIRIN EC 81 MG PO TBEC: 81.0000 mg | DELAYED_RELEASE_TABLET | Freq: Every day | ORAL | 3 refills | Status: DC

## 2019-10-20 NOTE — Progress Notes (Signed)
Primary Physician/Referring:  Everardo Beals, NP  Patient ID: Breanna Palmer, female    DOB: 11-04-1960, 59 y.o.   MRN: [id number]  Chief Complaint  Patient presents with   Abnormal ECG   New Patient (Initial Visit)    Referred by Alesia Richards, PA-C   HPI:    Breanna Palmer  is a 59 y.o. Caucasian female with hyperlipidemia, diabetes mellitus, chronic palpitations, GERD, spinal stenosis and chronic back pain, referred to me for evaluation of chest pain and abnormal EKG.  Patient was at the [facility] for vacation, had developed severe abdominal pain and also chest pain with radiation to the back, described as burning sensation and presented to an urgent care where EKG revealed left bundle branch block and she was emergently transferred to [facility] where she was evaluated for aortic dissection by CTA and CT of the abdomen and there was question of gastric perforation, underwent laparotomy and found to have no significant abnormality but discharged home on Protonix and advised her to follow-up with cardiologist.  She still continues to have burning sensation in her chest but symptoms have improved.  She is now recuperating from abdominal surgery.  Denies dyspnea.  Palpitations have been ongoing for several years and was started on metoprolol.  In spite of this patient continues to have sudden onset palpitations with her heart rate goes up to 100 to 120 bpm.  Past Medical History:  Diagnosis Date   Anxiety    Arthritis    Depression    Dysrhythmia    Dysuria 09/15/2016   Esophageal ulcer    Fibromyalgia    GERD (gastroesophageal reflux disease)    Hematuria, gross 09/15/2016   History of kidney stones    Hyperlipidemia    Menopause    AGE 59   Missed ab    NSVD (normal spontaneous vaginal delivery)    X2   Pneumonia    Pre-diabetes    Seasonal allergies    Sleep apnea    possible sleep apnea, was using a CPAP at one time, but not now   Past  Surgical History:  Procedure Laterality Date   COLONOSCOPY     DILATION AND CURETTAGE OF UTERUS     FOOT SURGERY  2011   right   FOOT SURGERY Right    spurs   KNEE ARTHROSCOPY  1998, 2008   right   MAXIMUM ACCESS (MAS) TRANSFORAMINAL LUMBAR INTERBODY FUSION (TLIF) 2 LEVEL N/A 05/01/2019   Procedure: Transforaminal Lumbar Interbody Fusion - Lumbar Three-Lumbar Four - Lumbar Four-Lumbar Five;  Surgeon: [name], MD;  Location: [place];  Service: Neurosurgery;  Laterality: N/A;  Transforaminal Lumbar Interbody Fusion - Lumbar Three-Lumbar Four - Lumbar Four-Lumbar Five   VAGINAL HYSTERECTOMY  1997   Family History  Problem Relation Age of Onset   Colon cancer Paternal Grandmother 48   Diabetes Mother    Diabetes Sister    Cancer Father        prostate   Breast cancer Paternal Aunt 37   Esophageal cancer Neg Hx    Stomach cancer Neg Hx    Rectal cancer Neg Hx     Social History   Tobacco Use   Smoking status: Former Smoker    Types: Cigarettes    Quit date: 10/20/2010    Years since quitting: 9.0   Smokeless tobacco: Never Used  Substance Use Topics   Alcohol use: Yes    Alcohol/week: 2.0 standard drinks    Types: 2  Cans of beer per week    Comment: weekends   Marital Status: Married  ROS  Review of Systems  Cardiovascular: Positive for chest pain and palpitations. Negative for dyspnea on exertion and leg swelling.  Gastrointestinal: Positive for heartburn. Negative for melena.   Objective  Blood pressure 128/70, pulse 97, resp. rate 16, height 5' 6.5" (1.689 m), weight 143 lb (64.9 kg), SpO2 100 %.  Vitals with BMI 10/20/2019 05/03/2019 05/03/2019  Height 5' 6.5" - -  Weight 143 lbs - -  BMI 01.60 - -  Systolic 109 323 557  Diastolic 70 65 56  Pulse 97 114 108     Physical Exam Cardiovascular:     Rate and Rhythm: Normal rate and regular rhythm.     Pulses: Intact distal pulses.     Heart sounds: Normal heart sounds. No murmur heard.  No  gallop.      Comments: No leg edema, no JVD. Pulmonary:     Effort: Pulmonary effort is normal.     Breath sounds: Normal breath sounds.  Abdominal:     General: Bowel sounds are normal.     Palpations: Abdomen is soft.     Comments: Laparotomy scar looks healthy    Laboratory examination:   Recent Labs    04/27/19 1008  NA 141  K 4.1  CL 106  CO2 24  GLUCOSE 113*  BUN 15  CREATININE 0.62  CALCIUM 9.1  GFRNONAA >60  GFRAA >60   CrCl cannot be calculated (Patient's most recent lab result is older than the maximum 21 days allowed.).  CMP Latest Ref Rng & Units 04/27/2019 01/14/2009  Glucose 70 - 99 mg/dL 113(H) 100(H)  BUN 6 - 20 mg/dL 15 13  Creatinine 0.44 - 1.00 mg/dL 0.62 0.84  Sodium 135 - 145 mmol/L 141 135  Potassium 3.5 - 5.1 mmol/L 4.1 4.2  Chloride 98 - 111 mmol/L 106 100  CO2 22 - 32 mmol/L 24 28  Calcium 8.9 - 10.3 mg/dL 9.1 9.3  Total Protein 6.0 - 8.3 g/dL - 6.8  Total Bilirubin 0.3 - 1.2 mg/dL - 0.6  Alkaline Phos 39 - 117 U/L - 60  AST 0 - 37 U/L - 23  ALT 0 - 35 U/L - 27   CBC Latest Ref Rng & Units 04/27/2019 09/05/2013 01/14/2009  WBC 4.0 - 10.5 K/uL 7.2 8.5 12.7(H)  Hemoglobin 12.0 - 15.0 g/dL 13.9 13.9 15.3(H)  Hematocrit 36 - 46 % 42.8 40.4 43.7  Platelets 150 - 400 K/uL 333 324.0 331    Lipid Panel No results for input(s): CHOL, TRIG, LDLCALC, VLDL, HDL, CHOLHDL, LDLDIRECT in the last 8760 hours.  HEMOGLOBIN A1C Lab Results  Component Value Date   HGBA1C 5.8 (H) 04/27/2019   MPG 119.76 04/27/2019   TSH No results for input(s): TSH in the last 8760 hours.  External labs:   NA  Medications and allergies   Allergies  Allergen Reactions   Morphine And Related Itching   Sulfa Antibiotics Rash    Mild; has taken since and been ok with it     Outpatient Medications Prior to Visit  Medication Sig Dispense Refill   Cholecalciferol (VITAMIN D3) 50 MCG (2000 UT) TABS Take 2,000 Units by mouth every evening.     DULoxetine  (CYMBALTA) 60 MG capsule Take 60 mg by mouth daily.     Melatonin 10 MG SUBL Place 10 mg under the tongue.     metFORMIN (GLUCOPHAGE) 500 MG tablet Take  500 mg by mouth 2 (two) times daily.     pantoprazole (PROTONIX) 40 MG tablet TAKE 1 TABLET DAILY (Patient taking differently: Take 40 mg by mouth daily. ) 90 tablet 3   rosuvastatin (CRESTOR) 40 MG tablet Take 40 mg by mouth at bedtime.      metoprolol succinate (TOPROL-XL) 50 MG 24 hr tablet metoprolol succinate ER 50 mg tablet,extended release 24 hr  TAKE 1 TABLET BY MOUTH EVERY DAY     clonazePAM (KLONOPIN) 1 MG tablet Take 0.5-1 mg by mouth 2 (two) times daily as needed for anxiety.  (Patient not taking: Reported on 10/20/2019)     methocarbamol (ROBAXIN) 500 MG tablet Take 1 tablet (500 mg total) by mouth every 6 (six) hours as needed for muscle spasms. 60 tablet 1   oxyCODONE 10 MG TABS Take 1 tablet (10 mg total) by mouth every 4 (four) hours as needed for moderate pain ((score 4 to 6)). 40 tablet 0   No facility-administered medications prior to visit.     Radiology:   No results found.  Cardiac Studies:   NA EKG  EKG 10/20/2019: Normal sinus rhythm with rate of 94 bpm, left atrial enlargement, left axis deviation, left bundle branch block.  No further analysis. No significant change from 04/27/2019.  Assessment     ICD-10-CM   1. Atypical chest pain  R07.89 EKG 12-Lead    PCV MYOCARDIAL PERFUSION WITH LEXISCAN    PCV ECHOCARDIOGRAM COMPLETE    aspirin EC 81 MG tablet  2. LBBB (left bundle branch block)  I44.7 PCV MYOCARDIAL PERFUSION WITH LEXISCAN    PCV ECHOCARDIOGRAM COMPLETE  3. Hypercholesteremia  E78.00   4. Type 2 diabetes mellitus without complication, without long-term current use of insulin (HCC)  E11.9   5. Palpitations  R00.2 acebutolol (SECTRAL) 200 MG capsule     Medications Discontinued During This Encounter  Medication Reason   oxyCODONE 10 MG TABS Patient Preference   methocarbamol  (ROBAXIN) 500 MG tablet Patient Preference   metoprolol succinate (TOPROL-XL) 50 MG 24 hr tablet Change in therapy    Meds ordered this encounter  Medications   acebutolol (SECTRAL) 200 MG capsule    Sig: Take 1 capsule (200 mg total) by mouth 2 (two) times daily.    Dispense:  60 capsule    Refill:  2    Discontinue Metoprolol   aspirin EC 81 MG tablet    Sig: Take 1 tablet (81 mg total) by mouth daily.    Dispense:  90 tablet    Refill:  3    Recommendations:   CATERRA OSTROFF is a 59 y.o.  Caucasian female with hyperlipidemia, diabetes mellitus, chronic palpitations, GERD, spinal stenosis and chronic back pain, referred to me for evaluation of chest pain and abnormal EKG.  Patient has underlying left bundle branch block.  I reviewed her prior records, she has had LBBB also in February of this year prior to her spinal surgery.  She has not had any cardiac work-up. Schedule for a Lexiscan Nuclear stress test to evaluate for myocardial ischemia. Patient unable to do treadmill stress testing due to LBBB and back pain. Will schedule for an echocardiogram. Patient instructed to start ASA  81mg  q daily for prophylaxis until at least evaluation is complete.   With regard to chronic palpitations, I suspect she probably has deconditioning and underlying tachycardia.  I will discontinue metoprolol succinate and switch her to acebutolol 200 mg BID.  With regard to hyperlipidemia, she  is presently on high intensity high-dose Crestor/statin, continue the same.  Her risk factors for CAD also includes diabetes mellitus.  Office visit following the work-up/investigations.   External labs and records reviewed, except for CBC and CMP I did not find anything useful from [facility], a chest x-ray was performed which was normal, there is mention of CT angiogram of the chest to exclude aortic dissection, I do not have the report of the same.  Patient will try to make attempts to get the reports.  I do  not have discharge summary.   Adrian Prows, MD, [facility] 10/20/2019, 5:20 PM Office: [phone]

## 2019-10-24 ENCOUNTER — Other Ambulatory Visit: Payer: Self-pay

## 2019-10-24 ENCOUNTER — Ambulatory Visit (HOSPITAL_COMMUNITY)
Admission: RE | Admit: 2019-10-24 | Discharge: 2019-10-24 | Disposition: A | Discharge status: Home / Self Care | Payer: BC Managed Care – PPO | Source: Ambulatory Visit | Attending: Cardiology | Admitting: Cardiology

## 2019-10-24 ENCOUNTER — Other Ambulatory Visit (HOSPITAL_COMMUNITY): Payer: BC Managed Care – PPO

## 2019-10-24 DIAGNOSIS — Z136 Encounter for screening for cardiovascular disorders: Secondary | ICD-10-CM | POA: Diagnosis present

## 2019-10-24 DIAGNOSIS — I447 Left bundle-branch block, unspecified: Secondary | ICD-10-CM | POA: Diagnosis not present

## 2019-10-24 DIAGNOSIS — I071 Rheumatic tricuspid insufficiency: Secondary | ICD-10-CM | POA: Insufficient documentation

## 2019-10-24 DIAGNOSIS — R0789 Other chest pain: Secondary | ICD-10-CM | POA: Diagnosis not present

## 2019-10-24 DIAGNOSIS — R079 Chest pain, unspecified: Secondary | ICD-10-CM | POA: Insufficient documentation

## 2019-10-24 LAB — ECHOCARDIOGRAM COMPLETE: S' Lateral: 3.4 cm

## 2019-10-24 NOTE — Progress Notes (Signed)
  Echocardiogram 2D Echocardiogram has been performed.  Breanna Palmer 10/24/2019, 4:03 PM

## 2019-10-25 ENCOUNTER — Ambulatory Visit
Admission: RE | Admit: 2019-10-25 | Discharge: 2019-10-25 | Disposition: A | Discharge status: Home / Self Care | Payer: BC Managed Care – PPO | Source: Ambulatory Visit | Attending: Neurological Surgery | Admitting: Neurological Surgery

## 2019-10-25 ENCOUNTER — Ambulatory Visit: Payer: BC Managed Care – PPO

## 2019-10-25 DIAGNOSIS — I447 Left bundle-branch block, unspecified: Secondary | ICD-10-CM

## 2019-10-25 DIAGNOSIS — R0789 Other chest pain: Secondary | ICD-10-CM

## 2019-10-25 DIAGNOSIS — G8929 Other chronic pain: Secondary | ICD-10-CM

## 2019-11-27 ENCOUNTER — Encounter: Payer: Self-pay | Admitting: Cardiology

## 2019-11-27 ENCOUNTER — Ambulatory Visit: Payer: BC Managed Care – PPO | Admitting: Cardiology

## 2019-11-27 ENCOUNTER — Other Ambulatory Visit: Payer: Self-pay

## 2019-11-27 VITALS — BP 134/63 | HR 89 | Resp 16 | Ht 66.0 in | Wt 144.0 lb

## 2019-11-27 DIAGNOSIS — R030 Elevated blood-pressure reading, without diagnosis of hypertension: Secondary | ICD-10-CM

## 2019-11-27 DIAGNOSIS — R0789 Other chest pain: Secondary | ICD-10-CM

## 2019-11-27 DIAGNOSIS — I10 Essential (primary) hypertension: Secondary | ICD-10-CM

## 2019-11-27 DIAGNOSIS — R002 Palpitations: Secondary | ICD-10-CM

## 2019-11-27 DIAGNOSIS — I447 Left bundle-branch block, unspecified: Secondary | ICD-10-CM

## 2019-11-27 DIAGNOSIS — E119 Type 2 diabetes mellitus without complications: Secondary | ICD-10-CM

## 2019-11-27 DIAGNOSIS — E7800 Pure hypercholesterolemia, unspecified: Secondary | ICD-10-CM

## 2019-11-27 MED ORDER — ACEBUTOLOL HCL 200 MG PO CAPS: 200.0000 mg | ORAL_CAPSULE | Freq: Two times a day (BID) | ORAL | 0 refills | Status: DC

## 2019-11-27 NOTE — Progress Notes (Signed)
Primary Physician/Referring:  Everardo Beals, NP  Patient ID: Breanna Palmer, female    DOB: [birth year], 59 y.o.   MRN: [id number]  Chief Complaint  Patient presents with  . Follow-up    4 weeks  . Chest Pain  . Results   HPI:    Breanna Palmer  is a 59 y.o. Caucasian female with hyperlipidemia, diabetes mellitus, chronic palpitations, GERD, spinal stenosis and chronic back pain, referred to me for evaluation of chest pain and abnormal EKG.  Patient was at the [facility] for vacation, had developed severe abdominal pain and also chest pain with radiation to the back, described as burning sensation and presented to an urgent care where EKG revealed left bundle branch block and she was emergently transferred to [facility] where she was evaluated for aortic dissection by CTA and CT of the abdomen and there was question of gastric perforation, underwent laparotomy and found to have no significant abnormality but discharged home on Protonix and advised her to follow-up with cardiologist.  Today, she presents for 1 month follow-up of chest pain.  At the last visit , she was scheduled for nuclear stress test and echocardiogram, started aspirin 81 mg daily at least until cardiac evaluation complete. She was also switched from metoprolol succinate to acebutolol 200 mg twice daily for chronic palpitations. Since then she continues to have occasional burning chest discomfort approximately 2-3 times per week. She states she wakes up feeling nauseated occasionally as well. She has not noticed any palpitations since starting acebutolol. Denies any dyspnea, leg swelling.  Past Medical History:  Diagnosis Date  . Anxiety   . Arthritis   . Depression   . Dysrhythmia   . Dysuria 09/15/2016  . Esophageal ulcer   . Fibromyalgia   . GERD (gastroesophageal reflux disease)   . Hematuria, gross 09/15/2016  . History of kidney stones   . Hyperlipidemia   . Menopause    AGE 48  . Missed ab   . NSVD  (normal spontaneous vaginal delivery)    X2  . Pneumonia   . Pre-diabetes   . Seasonal allergies   . Sleep apnea    possible sleep apnea, was using a CPAP at one time, but not now   Past Surgical History:  Procedure Laterality Date  . COLONOSCOPY    . DILATION AND CURETTAGE OF UTERUS    . FOOT SURGERY  2011   right  . FOOT SURGERY Right    spurs  . KNEE ARTHROSCOPY  1998, 2008   right  . MAXIMUM ACCESS (MAS) TRANSFORAMINAL LUMBAR INTERBODY FUSION (TLIF) 2 LEVEL N/A 05/01/2019   Procedure: Transforaminal Lumbar Interbody Fusion - Lumbar Three-Lumbar Four - Lumbar Four-Lumbar Five;  Surgeon: [name], MD;  Location: [facility] OR;  Service: Neurosurgery;  Laterality: N/A;  Transforaminal Lumbar Interbody Fusion - Lumbar Three-Lumbar Four - Lumbar Four-Lumbar Five  . VAGINAL HYSTERECTOMY  1997   Family History  Problem Relation Age of Onset  . Colon cancer Paternal Grandmother 13  . Diabetes Mother   . Diabetes Sister   . Cancer Father        prostate  . Breast cancer Paternal Aunt 1  . Esophageal cancer Neg Hx   . Stomach cancer Neg Hx   . Rectal cancer Neg Hx     Social History   Tobacco Use  . Smoking status: Former Smoker    Types: Cigarettes    Quit date: 10/19/1997    Years since quitting: 22.1  .  Smokeless tobacco: Never Used  Substance Use Topics  . Alcohol use: Yes    Alcohol/week: 2.0 standard drinks    Types: 2 Cans of beer per week    Comment: weekends   Marital Status: Married   ROS  Review of Systems  Cardiovascular: Positive for chest pain. Negative for dyspnea on exertion, leg swelling and palpitations.  Gastrointestinal: Positive for heartburn and nausea.   Objective  Blood pressure 134/63, pulse 89, resp. rate 16, height 5\' 6"  (1.676 m), weight 65.3 kg, SpO2 96 %.  Vitals with BMI 11/27/2019 10/20/2019 05/03/2019  Height 5\' 6"  5' 6.5" -  Weight 144 lbs 143 lbs -  BMI 74.16 38.45 -  Systolic 364 680 321  Diastolic 63 70 65  Pulse 89 97 114      Physical Exam Constitutional:      Comments: Well developed female in no acute distress.   Cardiovascular:     Rate and Rhythm: Normal rate and regular rhythm.     Pulses: Intact distal pulses.     Heart sounds: Normal heart sounds. No murmur heard.  No gallop.      Comments: No leg edema, no JVD. Pulmonary:     Effort: Pulmonary effort is normal.     Breath sounds: Normal breath sounds.  Neurological:     Mental Status: She is oriented to person, place, and time.    Laboratory examination:   Recent Labs    04/27/19 1008  NA 141  K 4.1  CL 106  CO2 24  GLUCOSE 113*  BUN 15  CREATININE 0.62  CALCIUM 9.1  GFRNONAA >60  GFRAA >60   CrCl cannot be calculated (Patient's most recent lab result is older than the maximum 21 days allowed.).  CMP Latest Ref Rng & Units 04/27/2019 01/14/2009  Glucose 70 - 99 mg/dL 113(H) 100(H)  BUN 6 - 20 mg/dL 15 13  Creatinine 0.44 - 1.00 mg/dL 0.62 0.84  Sodium 135 - 145 mmol/L 141 135  Potassium 3.5 - 5.1 mmol/L 4.1 4.2  Chloride 98 - 111 mmol/L 106 100  CO2 22 - 32 mmol/L 24 28  Calcium 8.9 - 10.3 mg/dL 9.1 9.3  Total Protein 6.0 - 8.3 g/dL - 6.8  Total Bilirubin 0.3 - 1.2 mg/dL - 0.6  Alkaline Phos 39 - 117 U/L - 60  AST 0 - 37 U/L - 23  ALT 0 - 35 U/L - 27   CBC Latest Ref Rng & Units 04/27/2019 09/05/2013 01/14/2009  WBC 4.0 - 10.5 K/uL 7.2 8.5 12.7(H)  Hemoglobin 12.0 - 15.0 g/dL 13.9 13.9 15.3(H)  Hematocrit 36 - 46 % 42.8 40.4 43.7  Platelets 150 - 400 K/uL 333 324.0 331    Lipid Panel No results for input(s): CHOL, TRIG, LDLCALC, VLDL, HDL, CHOLHDL, LDLDIRECT in the last 8760 hours.  HEMOGLOBIN A1C Lab Results  Component Value Date   HGBA1C 5.8 (H) 04/27/2019   MPG 119.76 04/27/2019   TSH No results for input(s): TSH in the last 8760 hours.  External labs:  NA  Medications and allergies   Allergies  Allergen Reactions  . Morphine And Related Itching  . Sulfa Antibiotics Rash    Mild; has taken since and been ok  with it     Outpatient Medications Prior to Visit  Medication Sig Dispense Refill  . aspirin EC 81 MG tablet Take 1 tablet (81 mg total) by mouth daily. 90 tablet 3  . Cholecalciferol (VITAMIN D3) 50 MCG (2000 UT) TABS  Take 2,000 Units by mouth every evening.    . clonazePAM (KLONOPIN) 1 MG tablet Take 0.5-1 mg by mouth 2 (two) times daily as needed for anxiety.     . diphenhydrAMINE (BENADRYL) 25 mg capsule Take 25 mg by mouth every 6 (six) hours as needed.    . DULoxetine (CYMBALTA) 60 MG capsule Take 60 mg by mouth daily.    . Melatonin 10 MG SUBL Place 10 mg under the tongue.    . metFORMIN (GLUCOPHAGE) 500 MG tablet Take 500 mg by mouth 2 (two) times daily.    . pantoprazole (PROTONIX) 40 MG tablet TAKE 1 TABLET DAILY (Patient taking differently: Take 40 mg by mouth 2 (two) times daily. ) 90 tablet 3  . rosuvastatin (CRESTOR) 40 MG tablet Take 40 mg by mouth at bedtime.     Marland Kitchen acebutolol (SECTRAL) 200 MG capsule Take 1 capsule (200 mg total) by mouth 2 (two) times daily. 60 capsule 2   No facility-administered medications prior to visit.     Radiology:   No results found.  Cardiac Studies:  Lexiscan/modified Bruce Sestamibi stress test 10/25/2019: Lexiscan/modified Bruce nuclear stress test performed using 1-day protocol. Stress EKG is non-diagnostic, as this is pharmacological stress test. In addition, stress EKG at 97% MPHR showed sinus tachycardia, LBBB.  Normal myocardial perfusion. Stress LVEF 51%. Low risk study.  Echocardiogram 10/24/2019:  1. Left ventricular ejection fraction, by estimation, is 55 to 60%. The left ventricle has normal function. The left ventricle has no regional wall motion abnormalities. Left ventricular diastolic parameters are indeterminate.  2. Right ventricular systolic function is normal. The right ventricular size is normal.  3. Mild tricuspid regurgitation. Estimated PASP 28 mmHg.   EKG EKG 10/20/2019: Normal sinus rhythm with rate of 94 bpm,  left atrial enlargement, left axis deviation, left bundle branch block.  No further analysis. No significant change from 04/27/2019.  Assessment     ICD-10-CM   1. Atypical chest pain  R07.89   2. LBBB (left bundle branch block)  I44.7   3. Hypercholesteremia  E78.00   4. Type 2 diabetes mellitus without complication, without long-term current use of insulin (HCC)  E11.9   5. Palpitations  R00.2 acebutolol (SECTRAL) 200 MG capsule  6. Primary hypertension  I10   7. Elevated BP without diagnosis of hypertension  R03.0      Medications Discontinued During This Encounter  Medication Reason  . acebutolol (SECTRAL) 200 MG capsule Reorder    Meds ordered this encounter  Medications  . acebutolol (SECTRAL) 200 MG capsule    Sig: Take 1 capsule (200 mg total) by mouth 2 (two) times daily.    Dispense:  180 capsule    Refill:  0    Discontinue Metoprolol    Recommendations:   EVADENE WARDRIP is a 59 y.o.  Caucasian female with hyperlipidemia, diabetes mellitus, chronic palpitations, GERD, spinal stenosis and chronic back pain, referred to me for evaluation of chest pain and abnormal EKG with LBBB. Upon review of records, this LBBB was also seen on EKG in February 2021 prior to her spinal surgery. She had no prior cardiac work up.   Since her last visit she has had a Occupational psychologist stress test as well as an echocardiogram which were both normal. By her description of the pain as well as her work up thus far, I suspect her chest discomfort may be caused by a GI etiology. She is scheduled to see a gastroenterologist, Dr. Clayburn Pert,  at the end of this month for evaluation. Given her diabetic status and other cardiac risk factors I recommend she continue on aspirin 81 mg daily.  If GI work-up is negative, I have advised her that she should certainly call us back so I can reevaluate her.  With regard to her chronic palpitations, she is tolerating acebutolol well and will continue on this. Her  blood pressure is mildly elevated in clinic today despite beta blocker therapy which is concerning for underlying hypertension. I discussed low sodium diet with patient.   Of note, we do not have the records from her hospitalization in [facility]. The patient tells me she has a paper copy at home and plans to bring them to her appointment with Dr. Germain Osgood for them to be included in her medical record within our system.   She will follow up on in clinic an as needed basis.  Blair Heys, PA Student 11/27/19 4:30 PM   Patient seen and examined in conjunction with Blair Heys, PA second year student at [employer].  Time spent is in direct patient face to face encounter not including the teaching and training involved.    Adrian Prows, MD, [facility] 11/27/2019, 5:26 PM Office: [phone]    CC: Clayburn Pert, MD; Barbee Shropshire, NP

## 2019-11-30 ENCOUNTER — Encounter: Payer: Self-pay | Admitting: *Deleted

## 2019-12-04 ENCOUNTER — Ambulatory Visit: Payer: Self-pay | Admitting: Cardiology

## 2019-12-11 ENCOUNTER — Encounter: Payer: Self-pay | Admitting: Internal Medicine

## 2019-12-11 ENCOUNTER — Ambulatory Visit (INDEPENDENT_AMBULATORY_CARE_PROVIDER_SITE_OTHER): Payer: BC Managed Care – PPO | Admitting: Internal Medicine

## 2019-12-11 VITALS — BP 122/84 | HR 86 | Ht 66.0 in | Wt 149.0 lb

## 2019-12-11 DIAGNOSIS — R935 Abnormal findings on diagnostic imaging of other abdominal regions, including retroperitoneum: Secondary | ICD-10-CM

## 2019-12-11 DIAGNOSIS — K219 Gastro-esophageal reflux disease without esophagitis: Secondary | ICD-10-CM

## 2019-12-11 DIAGNOSIS — Z9889 Other specified postprocedural states: Secondary | ICD-10-CM | POA: Diagnosis not present

## 2019-12-11 DIAGNOSIS — R110 Nausea: Secondary | ICD-10-CM | POA: Diagnosis not present

## 2019-12-11 MED ORDER — SUTAB 1479-225-188 MG PO TABS: 1.0000 | ORAL_TABLET | ORAL | 0 refills | Status: DC

## 2019-12-11 NOTE — Progress Notes (Signed)
Patient ID: Breanna Palmer, female   DOB: February 08, 1961, 59 y.o.   MRN: [id number] HPI: Karolyn Messing is a 59 year old female with a past medical history of reflux with esophagitis, adenomatous colon polyp, recent exploratory laparotomy for concern of perforated gastric ulcer due to free air in the upper abdomen in July 2021 who is seen in consult at the request of Everardo Beals to evaluate her history of possible ulcer with perforation.  She is here today with her husband.  She is known to me from 2017 when I saw her for GERD.  At that time she was doing well with daily pantoprazole and over-the-counter Gaviscon.  She reports that she has been taking daily pantoprazole 40 mg but while she was vacationing in [facility] this summer she developed discomfort in her upper chest which she describes as a nausea type feeling.  This was also present between her shoulder blades and seem to be worsening.  She had seen a story that cardiac chest pain in females can present atypically and so she went to urgent care.  They transported her to [facility] when her EKG revealed a left bundle branch block (not known at the time this was not new).  She was having some shortness of breath and anxiousness.  She recalls no specific abdominal pain.  On reviewing the history and physical it was noted that "24 to 36 hours of upper abdominal pain", "moderate fever".  White count on admission was 18.5, hemoglobin 13.2, temperature 99.2, pulse 103.  AST and ALT normal.  A stat CTA of the chest and a CT of the abdomen pelvis with contrast was performed on 10/08/2019.  This showed normal enhancement of the thoracic aorta without aneurysm or dissection.  No pulmonary embolism.  No acute cardiopulmonary abnormality.  Moderate thickened stomach compatible with gastritis with a small foci of extraluminal gas anterior to the gastric antrum suspected to reflect localized perforation of the gastric antral ulcer.  Surgical  consultation suggested.  Status post hysterectomy and posterior fusion lower lumbar spine.  She was taken to the operating room by Dr. Francie Massing who performed an exploratory laparotomy, abdominal washout and EGD to the fourth portion of the duodenum.  There appeared to be some thickening in the distal antrum and proximal duodenum but the organ appeared pink and healthy.  There was no evidence of any perforation, murky fluid, hyperemic or friable tissue.  Distal to the pylorus and duodenal bulb the tissue was normal-appearing.  The lesser sac was entered.  Finding no frank perforation Dr. Ronnald Ramp performed an upper endoscopy in the operating room which was grossly normal.  It was surmised that she probably had a very small microperforation abscess even within the omentum or potentially posterior to the duodenum within the vasculature and mesentery of that structure.  Her abdomen was closed.  She reports that she recovered though slowly and has had no further severe abdominal or chest pain.  She has been on pantoprazole 40 mg twice daily since July.  She will still have a nausea symptom and poor appetite but no vomiting.  She has been avoiding spicy foods.  She lost 20 pounds around the time of her surgery but is gained 10 of this back.  She will occasionally feel nausea feeling in her throat but is not having abdominal pain.  Her bowel movements have been normal.  She has no new medications.  She takes Cymbalta which she has been taking for years for a diagnosis  of fibromyalgia.  She also takes clonazepam 1 mg 1-2 times daily as needed for anxiousness.  She has had no blood in her stool or melena.  Past Medical History:  Diagnosis Date  . Anxiety   . Arthritis   . Depression   . Dysrhythmia   . Dysuria 09/15/2016  . Esophageal ulcer   . Fibromyalgia   . Gastritis   . GERD (gastroesophageal reflux disease)   . Hematuria, gross 09/15/2016  . History of kidney stones   . Hyperlipidemia   . Menopause     AGE 73  . Missed ab   . NSVD (normal spontaneous vaginal delivery)    X2  . Perforated peptic ulcer ([place])   . Pneumonia   . Pre-diabetes   . Seasonal allergies   . Sleep apnea    possible sleep apnea, was using a CPAP at one time, but not now    Past Surgical History:  Procedure Laterality Date  . COLONOSCOPY    . DILATION AND CURETTAGE OF UTERUS    . EXPLORATORY LAPAROTOMY  10/08/2019   with abdominal washout and EGD  . FOOT SURGERY  2011   right  . FOOT SURGERY Right    spurs  . KNEE ARTHROSCOPY  1998, 2008   right  . MAXIMUM ACCESS (MAS) TRANSFORAMINAL LUMBAR INTERBODY FUSION (TLIF) 2 LEVEL N/A 05/01/2019   Procedure: Transforaminal Lumbar Interbody Fusion - Lumbar Three-Lumbar Four - Lumbar Four-Lumbar Five;  Surgeon: [name], MD;  Location: [facility] OR;  Service: Neurosurgery;  Laterality: N/A;  Transforaminal Lumbar Interbody Fusion - Lumbar Three-Lumbar Four - Lumbar Four-Lumbar Five  . VAGINAL HYSTERECTOMY  1997    Outpatient Medications Prior to Visit  Medication Sig Dispense Refill  . acebutolol (SECTRAL) 200 MG capsule Take 1 capsule (200 mg total) by mouth 2 (two) times daily. 180 capsule 0  . aspirin EC 81 MG tablet Take 1 tablet (81 mg total) by mouth daily. 90 tablet 3  . Cholecalciferol (VITAMIN D3) 50 MCG (2000 UT) TABS Take 2,000 Units by mouth every evening.    . clonazePAM (KLONOPIN) 1 MG tablet Take 0.5-1 mg by mouth 2 (two) times daily as needed for anxiety.     . diphenhydrAMINE (BENADRYL) 25 mg capsule Take 25 mg by mouth every 6 (six) hours as needed.    . DULoxetine (CYMBALTA) 60 MG capsule Take 60 mg by mouth daily.    . Melatonin 10 MG SUBL Place 10 mg under the tongue.    . metFORMIN (GLUCOPHAGE) 500 MG tablet Take 500 mg by mouth 2 (two) times daily.    . pantoprazole (PROTONIX) 40 MG tablet Take 40 mg by mouth 2 (two) times daily.    . rosuvastatin (CRESTOR) 40 MG tablet Take 40 mg by mouth at bedtime.     . pantoprazole (PROTONIX) 40 MG tablet  TAKE 1 TABLET DAILY (Patient not taking: Reported on 12/11/2019) 90 tablet 3   No facility-administered medications prior to visit.    Allergies  Allergen Reactions  . Morphine And Related Itching  . Sulfa Antibiotics Rash    Mild; has taken since and been ok with it    Family History  Problem Relation Age of Onset  . Colon cancer Paternal Grandmother 75  . Diabetes Mother   . Diabetes Sister   . Cancer Father        prostate  . Breast cancer Paternal Aunt 60  . Esophageal cancer Neg Hx   . Stomach cancer Neg  Hx   . Rectal cancer Neg Hx     Social History   Tobacco Use  . Smoking status: Former Smoker    Types: Cigarettes    Quit date: 10/19/1997    Years since quitting: 22.1  . Smokeless tobacco: Never Used  Vaping Use  . Vaping Use: Never used  Substance Use Topics  . Alcohol use: Yes    Alcohol/week: 2.0 standard drinks    Types: 2 Cans of beer per week    Comment: weekends  . Drug use: No    ROS: As per history of present illness, otherwise negative  BP 122/84 (BP Location: Right Arm, Patient Position: Sitting, Cuff Size: Normal)   Pulse 86   Ht 5\' 6"  (1.676 m)   Wt 149 lb (67.6 kg)   SpO2 98%   BMI 24.05 kg/m  Constitutional: Well-developed and well-nourished. No distress. HEENT: Normocephalic and atraumatic.  Conjunctivae are normal.  No scleral icterus. Neck: Neck supple. Trachea midline. Cardiovascular: Normal rate, regular rhythm and intact distal pulses. No M/R/G Pulmonary/chest: Effort normal and breath sounds normal. No wheezing, rales or rhonchi. Abdominal: Soft, nontender, nondistended. Bowel sounds active throughout. There are no masses palpable. No hepatosplenomegaly.  Well-healed laparotomy scar. Extremities: no clubbing, cyanosis, or edema Neurological: Alert and oriented to person place and time. Skin: Skin is warm and dry.  Psychiatric: Normal mood and affect. Behavior is normal.  RELEVANT LABS AND IMAGING: CBC    Component Value  Date/Time   WBC 7.2 04/27/2019 1008   RBC 4.67 04/27/2019 1008   HGB 13.9 04/27/2019 1008   HCT 42.8 04/27/2019 1008   PLT 333 04/27/2019 1008   MCV 91.6 04/27/2019 1008   MCH 29.8 04/27/2019 1008   MCHC 32.5 04/27/2019 1008   RDW 13.0 04/27/2019 1008   LYMPHSABS 2.0 04/27/2019 1008   MONOABS 0.7 04/27/2019 1008   EOSABS 0.2 04/27/2019 1008   BASOSABS 0.1 04/27/2019 1008     ASSESSMENT/PLAN:  59 year old female with a past medical history of reflux with esophagitis, adenomatous colon polyp, recent exploratory laparotomy for concern of perforated gastric ulcer due to free air in the upper abdomen in July 2021 who is seen in consult at the request of Everardo Beals to evaluate her history of possible ulcer with perforation.   1. ? Perforated gastric or duodenal ulcer/hx GERD with esophagitis/nausea --her presentation earlier this summer is perplexing despite the CT findings suggesting possible perforated ulcer.  No perforation was seen on exploratory laparotomy or upper endoscopy performed by surgery.  She has many questions as to how the CT could be so abnormal and the surgery findings without specific abnormality.  These are good questions though I am not sure were going to be able to find an answer.  She brings digital images on CD for our review.  She has been on twice daily PPI since July.  She is not having abdominal symptoms but rather more proximal chest/esophageal symptoms raising the question of uncontrolled reflux, esophageal spasm.  I have recommended the following: --I will plan to review outside imaging with local body radiologist --Continue pantoprazole 40 mg twice daily --Repeat upper endoscopy is recommended, we discussed the risk, benefits and alternatives and she is agreeable and wishes to proceed.  We will perform colonoscopy at the same time, see #2  2.  Colon cancer screening --last colonoscopy was nearly 10 years ago.  However in the setting of possible perforated  viscus without clear etiology, I would recommend repeating colonoscopy at  the same time as her upper endoscopy.  We discussed the risk, benefits and alternatives and she is agreeable and wishes to proceed      BR:AXENMM[facility], [place], [address] [place],  [place] [address]

## 2019-12-11 NOTE — Patient Instructions (Signed)
You have been scheduled for an endoscopy and colonoscopy. Please follow the written instructions given to you at your visit today. Please pick up your prep supplies at the pharmacy within the next 1-3 days. If you use inhalers (even only as needed), please bring them with you on the day of your procedure.  If you are age 59 or older, your body mass index should be between 23-30. Your Body mass index is 24.05 kg/m. If this is out of the aforementioned range listed, please consider follow up with your Primary Care Provider.  If you are age 10 or younger, your body mass index should be between 19-25. Your Body mass index is 24.05 kg/m. If this is out of the aformentioned range listed, please consider follow up with your Primary Care Provider.   Due to recent changes in healthcare laws, you may see the results of your imaging and laboratory studies on MyChart before your provider has had a chance to review them.  We understand that in some cases there may be results that are confusing or concerning to you. Not all laboratory results come back in the same time frame and the provider may be waiting for multiple results in order to interpret others.  Please give [place] 48 hours in order for your provider to thoroughly review all the results before contacting the office for clarification of your results.

## 2019-12-28 ENCOUNTER — Ambulatory Visit: Payer: BC Managed Care – PPO | Admitting: Cardiology

## 2019-12-28 ENCOUNTER — Other Ambulatory Visit: Payer: Self-pay | Admitting: Physical Medicine and Rehabilitation

## 2019-12-28 DIAGNOSIS — M5136 Other intervertebral disc degeneration, lumbar region: Secondary | ICD-10-CM

## 2020-01-24 ENCOUNTER — Other Ambulatory Visit: Payer: Self-pay

## 2020-01-24 ENCOUNTER — Encounter: Payer: Self-pay | Admitting: Internal Medicine

## 2020-01-24 ENCOUNTER — Ambulatory Visit (AMBULATORY_SURGERY_CENTER): Payer: BC Managed Care – PPO | Admitting: Internal Medicine

## 2020-01-24 VITALS — BP 131/106 | HR 73 | Temp 98.4°F | Resp 15 | Ht 66.0 in | Wt 149.0 lb

## 2020-01-24 DIAGNOSIS — K2950 Unspecified chronic gastritis without bleeding: Secondary | ICD-10-CM | POA: Diagnosis not present

## 2020-01-24 DIAGNOSIS — K2970 Gastritis, unspecified, without bleeding: Secondary | ICD-10-CM

## 2020-01-24 DIAGNOSIS — R935 Abnormal findings on diagnostic imaging of other abdominal regions, including retroperitoneum: Secondary | ICD-10-CM

## 2020-01-24 DIAGNOSIS — Z1211 Encounter for screening for malignant neoplasm of colon: Secondary | ICD-10-CM

## 2020-01-24 DIAGNOSIS — K5140 Inflammatory polyps of colon without complications: Secondary | ICD-10-CM

## 2020-01-24 DIAGNOSIS — K219 Gastro-esophageal reflux disease without esophagitis: Secondary | ICD-10-CM

## 2020-01-24 DIAGNOSIS — D123 Benign neoplasm of transverse colon: Secondary | ICD-10-CM

## 2020-01-24 MED ORDER — SODIUM CHLORIDE 0.9 % IV SOLN: 500.0000 mL | Freq: Once | INTRAVENOUS | Status: DC

## 2020-01-24 MED ORDER — PANTOPRAZOLE SODIUM 40 MG PO TBEC: 40.0000 mg | DELAYED_RELEASE_TABLET | Freq: Two times a day (BID) | ORAL | 3 refills | Status: DC

## 2020-01-24 NOTE — Progress Notes (Signed)
Pt's states no medical or surgical changes since previsit or office visit. 

## 2020-01-24 NOTE — Progress Notes (Signed)
Called to room to assist during endoscopic procedure.  Patient ID and intended procedure confirmed with present staff. Received instructions for my participation in the procedure from the performing physician.  

## 2020-01-24 NOTE — Progress Notes (Signed)
Report to PACU, RN, vss, BBS= Clear.  

## 2020-01-24 NOTE — Patient Instructions (Signed)
Thank you for allowing [place] to care for you today!  Await final biopsy results, approximately 7-10 days.  Will make recommendations, if any, at that time.  Resume previous diet and medications today.  Avoid all NSAIDS (Ibuprofen, Aspirin, Naproyxn).  Return to your normal activities tomorrow.   YOU HAD AN ENDOSCOPIC PROCEDURE TODAY AT [facility]:   Refer to the procedure report that was given to you for any specific questions about what was found during the examination.  If the procedure report does not answer your questions, please call your gastroenterologist to clarify.  If you requested that your care partner not be given the details of your procedure findings, then the procedure report has been included in a sealed envelope for you to review at your convenience later.  YOU SHOULD EXPECT: Some feelings of bloating in the abdomen. Passage of more gas than usual.  Walking can help get rid of the air that was put into your GI tract during the procedure and reduce the bloating. If you had a lower endoscopy (such as a colonoscopy or flexible sigmoidoscopy) you may notice spotting of blood in your stool or on the toilet paper. If you underwent a bowel prep for your procedure, you may not have a normal bowel movement for a few days.  Please Note:  You might notice some irritation and congestion in your nose or some drainage.  This is from the oxygen used during your procedure.  There is no need for concern and it should clear up in a day or so.  SYMPTOMS TO REPORT IMMEDIATELY:   Following lower endoscopy (colonoscopy or flexible sigmoidoscopy):  Excessive amounts of blood in the stool  Significant tenderness or worsening of abdominal pains  Swelling of the abdomen that is new, acute  Fever of 100F or higher   Following upper endoscopy (EGD)  Vomiting of blood or coffee ground material  New chest pain or pain under the shoulder blades  Painful or persistently difficult  swallowing  New shortness of breath  Fever of 100F or higher  Black, tarry-looking stools  For urgent or emergent issues, a gastroenterologist can be reached at any hour by calling [phone]. Do not use MyChart messaging for urgent concerns.    DIET:  We do recommend a small meal at first, but then you may proceed to your regular diet.  Drink plenty of fluids but you should avoid alcoholic beverages for 24 hours.  ACTIVITY:  You should plan to take it easy for the rest of today and you should NOT DRIVE or use heavy machinery until tomorrow (because of the sedation medicines used during the test).    FOLLOW UP: Our staff will call the number listed on your records 48-72 hours following your procedure to check on you and address any questions or concerns that you may have regarding the information given to you following your procedure. If we do not reach you, we will leave a message.  We will attempt to reach you two times.  During this call, we will ask if you have developed any symptoms of COVID 19. If you develop any symptoms (ie: fever, flu-like symptoms, shortness of breath, cough etc.) before then, please call [phone].  If you test positive for Covid 19 in the 2 weeks post procedure, please call and report this information to [place].    If any biopsies were taken you will be contacted by phone or by letter within the next 1-3 weeks.  Please  call us at [phone] if you have not heard about the biopsies in 3 weeks.    SIGNATURES/CONFIDENTIALITY: You and/or your care partner have signed paperwork which will be entered into your electronic medical record.  These signatures attest to the fact that that the information above on your After Visit Summary has been reviewed and is understood.  Full responsibility of the confidentiality of this discharge information lies with you and/or your care-partner.

## 2020-01-24 NOTE — Op Note (Signed)
[name] Patient Name: Breanna Palmer Procedure Date: 01/24/2020 2:55 PM MRN: [id number] Endoscopist: Jerene Bears , MD Age: 59 Referring MD:  Date of Birth: 1960-04-01 Gender: Female Account #: [id number] Procedure:                Colonoscopy Indications:              Screening for colorectal malignant neoplasm Medicines:                Monitored Anesthesia Care Procedure:                Pre-Anesthesia Assessment:                           - Prior to the procedure, a History and Physical                            was performed, and patient medications and                            allergies were reviewed. The patient's tolerance of                            previous anesthesia was also reviewed. The risks                            and benefits of the procedure and the sedation                            options and risks were discussed with the patient.                            All questions were answered, and informed consent                            was obtained. Prior Anticoagulants: The patient has                            taken no previous anticoagulant or antiplatelet                            agents. ASA Grade Assessment: II - A patient with                            mild systemic disease. After reviewing the risks                            and benefits, the patient was deemed in                            satisfactory condition to undergo the procedure.                           After obtaining informed consent, the colonoscope  was passed under direct vision. Throughout the                            procedure, the patient's blood pressure, pulse, and                            oxygen saturations were monitored continuously. The                            Colonoscope was introduced through the anus and                            advanced to the terminal ileum. The colonoscopy was                            performed  without difficulty. The patient tolerated                            the procedure well. The quality of the bowel                            preparation was good. The terminal ileum, ileocecal                            valve, appendiceal orifice, and rectum were                            photographed. Scope In: 3:20:08 PM Scope Out: 3:35:34 PM Scope Withdrawal Time: 0 hours 11 minutes 27 seconds  Total Procedure Duration: 0 hours 15 minutes 26 seconds  Findings:                 The digital rectal exam was normal.                           The terminal ileum appeared normal.                           A 6 mm polyp was found in the transverse colon. The                            polyp was sessile. The polyp was removed with a                            cold snare. Resection and retrieval were complete.                           The exam was otherwise without abnormality on                            direct and retroflexion views. Complications:            No immediate complications. Estimated Blood Loss:     Estimated blood loss was minimal. Impression:               -  The examined portion of the ileum was normal.                           - One 6 mm polyp in the transverse colon, removed                            with a cold snare. Resected and retrieved.                           - The examination was otherwise normal on direct                            and retroflexion views. Recommendation:           - Patient has a contact number available for                            emergencies. The signs and symptoms of potential                            delayed complications were discussed with the                            patient. Return to normal activities tomorrow.                            Written discharge instructions were provided to the                            patient.                           - Resume previous diet.                           - Continue present  medications.                           - Await pathology results.                           - Repeat colonoscopy is recommended. The                            colonoscopy date will be determined after pathology                            results from today's exam become available for                            review. Jerene Bears, MD 01/24/2020 3:57:54 PM This report has been signed electronically.

## 2020-01-24 NOTE — Op Note (Signed)
[facility] Patient Name: Earlie Arciga Procedure Date: 01/24/2020 2:56 PM MRN: [id number] Endoscopist: Jerene Bears , MD Age: 59 Referring MD:  Date of Birth: December 20, 1960 Gender: Female Account #: [id number] Procedure:                Upper GI endoscopy Indications:              Gastro-esophageal reflux disease, Abnormal CT of                            the GI tract showing thickening of stomach and                            small foci of extraluminal gas around antrum                            concerning for perforated gastric ulcer s/p ex-lap                            negative for source of extraluminal gas, Nausea Medicines:                Monitored Anesthesia Care Procedure:                Pre-Anesthesia Assessment:                           - Prior to the procedure, a History and Physical                            was performed, and patient medications and                            allergies were reviewed. The patient's tolerance of                            previous anesthesia was also reviewed. The risks                            and benefits of the procedure and the sedation                            options and risks were discussed with the patient.                            All questions were answered, and informed consent                            was obtained. Prior Anticoagulants: The patient has                            taken no previous anticoagulant or antiplatelet                            agents. ASA Grade Assessment: II - A patient with  mild systemic disease. After reviewing the risks                            and benefits, the patient was deemed in                            satisfactory condition to undergo the procedure.                           After obtaining informed consent, the endoscope was                            passed under direct vision. Throughout the                            procedure, the  patient's blood pressure, pulse, and                            oxygen saturations were monitored continuously. The                            Endoscope was introduced through the mouth, and                            advanced to the second part of duodenum. The upper                            GI endoscopy was accomplished without difficulty.                            The patient tolerated the procedure well. Scope In: Scope Out: Findings:                 The examined esophagus was normal.                           Patchy moderate inflammation characterized by                            congestion (edema) and erythema was found in the                            cardia, in the gastric fundus and in the gastric                            body. Biopsies were taken with a cold forceps for                            histology and Helicobacter pylori testing.                           The incisura and gastric antrum were normal.  Biopsies were taken with a cold forceps for                            histology and Helicobacter pylori testing.                           The examined duodenum was normal. Complications:            No immediate complications. Estimated Blood Loss:     Estimated blood loss was minimal. Impression:               - Normal esophagus.                           - Gastritis in the cardia, fundus and gastric body.                            No ulcerations seen. Biopsied.                           - Normal incisura and antrum. Biopsied.                           - Normal examined duodenum. Recommendation:           - Patient has a contact number available for                            emergencies. The signs and symptoms of potential                            delayed complications were discussed with the                            patient. Return to normal activities tomorrow.                            Written discharge instructions were  provided to the                            patient.                           - Resume previous diet.                           - Continue present medications.                           - Avoid aspirin, ibuprofen, naproxen, or other                            non-steroidal anti-inflammatory drugs.                           - Await pathology results. Jerene Bears, MD 01/24/2020 3:48:10 PM This report has been signed electronically.

## 2020-01-25 ENCOUNTER — Ambulatory Visit: Payer: BC Managed Care – PPO | Admitting: Cardiology

## 2020-01-25 ENCOUNTER — Encounter: Payer: Self-pay | Admitting: Cardiology

## 2020-01-25 VITALS — BP 115/67 | HR 74 | Resp 16 | Ht 66.0 in | Wt 150.0 lb

## 2020-01-25 DIAGNOSIS — R002 Palpitations: Secondary | ICD-10-CM

## 2020-01-25 DIAGNOSIS — K2101 Gastro-esophageal reflux disease with esophagitis, with bleeding: Secondary | ICD-10-CM

## 2020-01-25 DIAGNOSIS — I447 Left bundle-branch block, unspecified: Secondary | ICD-10-CM

## 2020-01-25 MED ORDER — ACEBUTOLOL HCL 200 MG PO CAPS: 200.0000 mg | ORAL_CAPSULE | Freq: Two times a day (BID) | ORAL | 3 refills | Status: DC

## 2020-01-25 NOTE — Progress Notes (Signed)
Primary Physician/Referring:  Everardo Beals, NP  Patient ID: MAKEYA HILGERT, female    DOB: [birth year], 59 y.o.   MRN: [id number]  Chief Complaint  Patient presents with  . Chest Pain  . Follow-up   HPI:    MALAI LADY  is a 59 y.o. Caucasian female with hyperlipidemia, diabetes mellitus, chronic palpitations, GERD, spinal stenosis and chronic back pain, left bundle branch block presents here for follow-up of chest pain.  I seen her 6 weeks ago, she has had a negative nuclear stress test and normal LV systolic function by echocardiogram in August 2021.  She underwent colonoscopy and EGD yesterday and presents here for follow-up.  States that she still is having abdominal discomfort.  Otherwise no other specific symptoms, states that since being on acebutolol she has not had any further episodes of palpitations.    Past Medical History:  Diagnosis Date  . Anxiety   . Arthritis   . Depression   . Dysrhythmia   . Dysuria 09/15/2016  . Esophageal ulcer   . Fibromyalgia   . Gastritis   . GERD (gastroesophageal reflux disease)   . Hematuria, gross 09/15/2016  . History of kidney stones   . Hyperlipidemia   . Menopause    AGE 23  . Missed ab   . NSVD (normal spontaneous vaginal delivery)    X2  . Perforated peptic ulcer ([place])   . Pneumonia   . Pre-diabetes   . Seasonal allergies   . Sleep apnea    possible sleep apnea, was using a CPAP at one time, but not now   Past Surgical History:  Procedure Laterality Date  . COLONOSCOPY    . DILATION AND CURETTAGE OF UTERUS    . EXPLORATORY LAPAROTOMY  10/08/2019   with abdominal washout and EGD  . FOOT SURGERY  2011   right  . FOOT SURGERY Right    spurs  . KNEE ARTHROSCOPY  1998, 2008   right  . MAXIMUM ACCESS (MAS) TRANSFORAMINAL LUMBAR INTERBODY FUSION (TLIF) 2 LEVEL N/A 05/01/2019   Procedure: Transforaminal Lumbar Interbody Fusion - Lumbar Three-Lumbar Four - Lumbar Four-Lumbar Five;  Surgeon: [name], MD;   Location[facility] OR;  Service: Neurosurgery;  Laterality: N/A;  Transforaminal Lumbar Interbody Fusion - Lumbar Three-Lumbar Four - Lumbar Four-Lumbar Five  . VAGINAL HYSTERECTOMY  1997   Family History  Problem Relation Age of Onset  . Colon cancer Paternal Grandmother 57  . Diabetes Mother   . Diabetes Sister   . Cancer Father        prostate  . Breast cancer Paternal Aunt 41  . Esophageal cancer Neg Hx   . Stomach cancer Neg Hx   . Rectal cancer Neg Hx     Social History   Tobacco Use  . Smoking status: Former Smoker    Types: Cigarettes    Quit date: 10/19/1997    Years since quitting: 22.2  . Smokeless tobacco: Never Used  Substance Use Topics  . Alcohol use: Yes    Alcohol/week: 2.0 standard drinks    Types: 2 Cans of beer per week    Comment: weekends   Marital Status: Married   ROS  Review of Systems  Cardiovascular: Negative for chest pain, dyspnea on exertion, leg swelling and palpitations.  Gastrointestinal: Positive for heartburn and nausea.   Objective  Blood pressure 115/67, pulse 74, resp. rate 16, height 5\' 6"  (1.676 m), weight 150 lb (68 kg), SpO2 97 %.  Vitals  with BMI 01/25/2020 01/24/2020 01/24/2020  Height 5\' 6"  - -  Weight 150 lbs - -  BMI 75.64 - -  Systolic 332 - 951  Diastolic 67 - 884  Pulse 74 73 75     Physical Exam Constitutional:      Comments: Well developed female in no acute distress.   Cardiovascular:     Rate and Rhythm: Normal rate and regular rhythm.     Pulses: Intact distal pulses.     Heart sounds: Normal heart sounds. No murmur heard.  No gallop.      Comments: No leg edema, no JVD. Pulmonary:     Effort: Pulmonary effort is normal.     Breath sounds: Normal breath sounds.  Neurological:     Mental Status: She is oriented to person, place, and time.    Laboratory examination:   Recent Labs    04/27/19 1008  NA 141  K 4.1  CL 106  CO2 24  GLUCOSE 113*  BUN 15  CREATININE 0.62  CALCIUM 9.1  GFRNONAA >60    GFRAA >60   CrCl cannot be calculated (Patient's most recent lab result is older than the maximum 21 days allowed.).  CMP Latest Ref Rng & Units 04/27/2019 01/14/2009  Glucose 70 - 99 mg/dL 113(H) 100(H)  BUN 6 - 20 mg/dL 15 13  Creatinine 0.44 - 1.00 mg/dL 0.62 0.84  Sodium 135 - 145 mmol/L 141 135  Potassium 3.5 - 5.1 mmol/L 4.1 4.2  Chloride 98 - 111 mmol/L 106 100  CO2 22 - 32 mmol/L 24 28  Calcium 8.9 - 10.3 mg/dL 9.1 9.3  Total Protein 6.0 - 8.3 g/dL - 6.8  Total Bilirubin 0.3 - 1.2 mg/dL - 0.6  Alkaline Phos 39 - 117 U/L - 60  AST 0 - 37 U/L - 23  ALT 0 - 35 U/L - 27   CBC Latest Ref Rng & Units 04/27/2019 09/05/2013 01/14/2009  WBC 4.0 - 10.5 K/uL 7.2 8.5 12.7(H)  Hemoglobin 12.0 - 15.0 g/dL 13.9 13.9 15.3(H)  Hematocrit 36 - 46 % 42.8 40.4 43.7  Platelets 150 - 400 K/uL 333 324.0 331    Lipid Panel No results for input(s): CHOL, TRIG, LDLCALC, VLDL, HDL, CHOLHDL, LDLDIRECT in the last 8760 hours.  HEMOGLOBIN A1C Lab Results  Component Value Date   HGBA1C 5.8 (H) 04/27/2019   MPG 119.76 04/27/2019   TSH No results for input(s): TSH in the last 8760 hours.  External labs:  NA  Medications and allergies   Allergies  Allergen Reactions  . Morphine And Related Itching  . Sulfa Antibiotics Rash    Mild; has taken since and been ok with it     Outpatient Medications Prior to Visit  Medication Sig Dispense Refill  . Cholecalciferol (VITAMIN D3) 50 MCG (2000 UT) TABS Take 2,000 Units by mouth every evening.    . clonazePAM (KLONOPIN) 1 MG tablet Take 0.5-1 mg by mouth 2 (two) times daily as needed for anxiety.     . diphenhydrAMINE (BENADRYL) 25 mg capsule Take 25 mg by mouth every 6 (six) hours as needed.     . DULoxetine (CYMBALTA) 60 MG capsule Take 60 mg by mouth daily.    . Melatonin 10 MG SUBL Place 10 mg under the tongue.    . metFORMIN (GLUCOPHAGE) 500 MG tablet Take 500 mg by mouth 2 (two) times daily.    . pantoprazole (PROTONIX) 40 MG tablet Take 1  tablet (40 mg total) by  mouth 2 (two) times daily before a meal. 60 tablet 3  . rosuvastatin (CRESTOR) 40 MG tablet Take 40 mg by mouth at bedtime.     Marland Kitchen acebutolol (SECTRAL) 200 MG capsule Take 1 capsule (200 mg total) by mouth 2 (two) times daily. 180 capsule 0  . aspirin EC 81 MG tablet Take 1 tablet (81 mg total) by mouth daily. (Patient not taking: Reported on 01/24/2020) 90 tablet 3   No facility-administered medications prior to visit.     Radiology:   No results found.  Cardiac Studies:  Lexiscan/modified Bruce Sestamibi stress test 10/25/2019: Lexiscan/modified Bruce nuclear stress test performed using 1-day protocol. Stress EKG is non-diagnostic, as this is pharmacological stress test. In addition, stress EKG at 97% MPHR showed sinus tachycardia, LBBB.  Normal myocardial perfusion. Stress LVEF 51%. Low risk study.  Echocardiogram 10/24/2019:  1. Left ventricular ejection fraction, by estimation, is 55 to 60%. The left ventricle has normal function. The left ventricle has no regional wall motion abnormalities. Left ventricular diastolic parameters are indeterminate.  2. Right ventricular systolic function is normal. The right ventricular size is normal.  3. Mild tricuspid regurgitation. Estimated PASP 28 mmHg.   EKG  EKG 01/25/2020: Normal sinus rhythm at rate of 83 bpm, left bundle branch block.  No further analysis.  No significant change from prior EKG.    EKG 10/20/2019: Normal sinus rhythm with rate of 94 bpm, left atrial enlargement, left axis deviation, left bundle branch block.  No further analysis. No significant change from 04/27/2019.  Assessment     ICD-10-CM   1. Gastroesophageal reflux disease with esophagitis and hemorrhage  K21.01   2. Palpitations  R00.2 EKG 12-Lead    acebutolol (SECTRAL) 200 MG capsule  3. LBBB (left bundle branch block)  I44.7 EKG 12-Lead     Medications Discontinued During This Encounter  Medication Reason  . aspirin EC 81 MG  tablet No longer needed (for PRN medications)  . acebutolol (SECTRAL) 200 MG capsule Reorder    Meds ordered this encounter  Medications  . acebutolol (SECTRAL) 200 MG capsule    Sig: Take 1 capsule (200 mg total) by mouth 2 (two) times daily.    Dispense:  180 capsule    Refill:  3    Recommendations:   KYNLIE JANE is a 59 y.o.  Caucasian female with hyperlipidemia, diabetes mellitus, chronic palpitations, GERD, spinal stenosis and chronic back pain, referred to me for evaluation of chest pain and abnormal EKG with LBBB. Upon review of records, this LBBB was also seen on EKG in February 2021 prior to her spinal surgery.  Since her last visit she has had a Occupational psychologist stress test as well as an echocardiogram which were both normal.   With regard to her chronic palpitations, she is tolerating acebutolol well and will continue on this. Her blood pressure was previously elevated and since being on his metoprolol, blood pressure has been well controlled and I suspect she probably has underlying primary hypertension.  She has been evaluated by gastroenterology, underwent endoscopy and colonoscopy yesterday revealing significant amount of gastritis and esophagitis.  Suspect her chest pain was related to this.  From cardiac standpoint she is stable, I have reviewed her records and reassured her.  I will see her back on a as needed basis.  She can also follow-up with her PCP for refill of acebutolol.    Adrian Prows, MD, [facility] 01/25/2020, 12:06 PM Office: [phone]

## 2020-01-26 ENCOUNTER — Telehealth: Payer: Self-pay

## 2020-01-26 NOTE — Telephone Encounter (Signed)
  Follow up Call-  Call back number 01/24/2020  Post procedure Call Back phone  # [phone]  Permission to leave phone message Yes  Some recent data might be hidden     Patient questions:  Do you have a fever, pain , or abdominal swelling? No. Pain Score  0 *  Have you tolerated food without any problems? Yes.    Have you been able to return to your normal activities? Yes.    Do you have any questions about your discharge instructions: Diet   No. Medications  No. Follow up visit  No.  Do you have questions or concerns about your Care? No.  Actions: * If pain score is 4 or above: No action needed, pain <4.  1. Have you developed a fever since your procedure? no  2.   Have you had an respiratory symptoms (SOB or cough) since your procedure? no  3.   Have you tested positive for COVID 19 since your procedure no  4.   Have you had any family members/close contacts diagnosed with the COVID 19 since your procedure?  no   If yes to any of these questions please route to Joylene John, RN and Joella Prince, RN

## 2020-02-02 ENCOUNTER — Encounter: Payer: Self-pay | Admitting: Internal Medicine

## 2020-02-02 ENCOUNTER — Telehealth: Payer: Self-pay | Admitting: Internal Medicine

## 2020-02-02 NOTE — Telephone Encounter (Signed)
Results letter discussed with pt over the phone. Pt states she is still having nausea and "stomach issues". Offered first available follow-up with pt but she states it has to be before the end of December due to insurance issues. States Dr. Hilarie Fredrickson told her he would work something out for her to make that happen. Please advise.

## 2020-02-05 NOTE — Telephone Encounter (Signed)
Patient called to follow up on her request for appointment with Dr. Hilarie Fredrickson before the end of December.

## 2020-02-05 NOTE — Telephone Encounter (Signed)
See note below. Pt states due to insurance she needs to be seen before the end of December and states Dr. Norman Herrlich told her he would work something out for her. Please advise.

## 2020-02-06 NOTE — Telephone Encounter (Signed)
Patient indicates that she cannot come on 02/21/20 @ 340 pm because she has to work. Indicates that she needs a virtual visit or telephone call from Dr Hilarie Fredrickson instead. I advised that Dr Hilarie Fredrickson was trying to accommodate her request for an appointment before the end of the year because he is currently booking into February 2022. She states that she understands this but he also understands her situation so she would appreciate a call back.

## 2020-02-06 NOTE — Telephone Encounter (Signed)
Virtual visit, mychart video visit Same date and time

## 2020-02-06 NOTE — Telephone Encounter (Signed)
02/21/20 at 1140 AM, work in so she may have to wait longer than expected

## 2020-02-07 ENCOUNTER — Ambulatory Visit
Admission: RE | Admit: 2020-02-07 | Discharge: 2020-02-07 | Disposition: A | Discharge status: Home / Self Care | Payer: BC Managed Care – PPO | Source: Ambulatory Visit | Attending: Physical Medicine and Rehabilitation | Admitting: Physical Medicine and Rehabilitation

## 2020-02-07 DIAGNOSIS — M5136 Other intervertebral disc degeneration, lumbar region: Secondary | ICD-10-CM

## 2020-02-07 NOTE — Telephone Encounter (Signed)
Informed pt of Virtual Visit through [facility], pt voiced understanding

## 2020-02-07 NOTE — Telephone Encounter (Signed)
Left voicemail for patient to call back. 

## 2020-02-21 ENCOUNTER — Telehealth (INDEPENDENT_AMBULATORY_CARE_PROVIDER_SITE_OTHER): Payer: BC Managed Care – PPO | Admitting: Internal Medicine

## 2020-02-21 ENCOUNTER — Telehealth: Payer: Self-pay | Admitting: *Deleted

## 2020-02-21 ENCOUNTER — Encounter: Payer: Self-pay | Admitting: Internal Medicine

## 2020-02-21 DIAGNOSIS — R110 Nausea: Secondary | ICD-10-CM | POA: Diagnosis not present

## 2020-02-21 DIAGNOSIS — K2950 Unspecified chronic gastritis without bleeding: Secondary | ICD-10-CM

## 2020-02-21 DIAGNOSIS — K219 Gastro-esophageal reflux disease without esophagitis: Secondary | ICD-10-CM | POA: Diagnosis not present

## 2020-02-21 MED ORDER — SUCRALFATE 1 G PO TABS: 1.0000 g | ORAL_TABLET | Freq: Three times a day (TID) | ORAL | 1 refills | Status: AC

## 2020-02-21 MED ORDER — PANTOPRAZOLE SODIUM 40 MG PO TBEC: 40.0000 mg | DELAYED_RELEASE_TABLET | Freq: Every day | ORAL | 3 refills | Status: DC

## 2020-02-21 NOTE — Telephone Encounter (Signed)
Sure, can hold for couple weeks and see if symptoms get better [employer]

## 2020-02-21 NOTE — Addendum Note (Signed)
Addended by: Larina Bras on: 02/21/2020 05:17 PM   Modules accepted: Orders

## 2020-02-21 NOTE — Telephone Encounter (Signed)
Dr Einar Gip-  Please Dr Pyrtle's office note dated 02/21/20. Would you be agreeable to holding this patient's acebutolol for a short period to see if it is contributing to her nausea? We would only need to hold it for 1 to 2 weeks to make this determination (none of her other medications are new in the timeframe of her nausea).   We would certainly appreciate your input on this situation.  Thanks,  Quintin Alto, [place]

## 2020-02-21 NOTE — Patient Instructions (Signed)
Please reduce your pantoprazole down to 40 mg once daily  We have sent the following medications to your pharmacy for you to pick up at your convenience: Carafate tablet 1 gram 3 times daily before meals and at bedtime  We will ask Dr. Einar Gip if it is okay to hold acebutolol to see if this is contributing to nausea. Once we have gotten a response from Dr Einar Gip regarding this, we will be in touch with you. Please do NOT stop this medication on your own.  If you are not better in 2 weeks, please call with an update. Our phone number is [phone].

## 2020-02-21 NOTE — Progress Notes (Signed)
Subjective:    Patient ID: Breanna Palmer, female    DOB: 10-Dec-1960, 59 y.o.   MRN: [id number]  This service was provided via telemedicine.  MyChart video visit was planned but not operational for this visit thus changed to telephone contact. The patient was located at home The provider was located in provider's GI office. The patient did consent to this telephone visit and is aware of possible charges through their insurance for this visit.   The persons participating in this telemedicine service were the patient and I. Time spent on call: 16 min   HPI Breanna Palmer is a 59 year old female with a history of GERD with prior esophagitis, history of adenomatous colon polyp, prior exploratory laparotomy with concern for perforated gastric ulcer as seen by CT scan in July 2021 with negative exploration who is seen for follow-up.  As above this is a virtual visit.  I saw her in the office on 12/11/2019 and for upper endoscopy and colonoscopy on 01/24/2020.  EGD revealed normal esophagus.  There was patchy moderate inflammation and erythema in the cardia and gastric fundus along with the gastric body.  Biopsies were obtained.  The gastric antrum and incisura were normal.  As was the examined duodenum.  Pathology from both the gastric cardia fundus and body showed slight chronic inflammation and hyperemia.  Negative for H. pylori.  No metaplasia or dysplasia.  Antral biopsies showed the same.  After that we increase her pantoprazole to 40 mg twice daily.  Her colonoscopy to the terminal ileum revealed a 6 mm transverse polyp that was resected and found to be inflammatory.  No adenomatous change.  She reports that she has had persistent nausea.  This does not matter if she eats or she does not eat.  She at times feels this sensation in her throat but reports she is always felt nausea in her throat.  She feels like she wants to vomit at times.  She has had no abdominal pain.  No symptoms like those  that occurred in July when she was diagnosed CT with perforated ulcer.  This symptom is worse in the morning.  Her appetite has been relatively decreased and she is drinking protein shakes.  Symptoms do not wake her up at night.  Bowel movements are regular without blood in stool or melena.   No new medications other than acebutolol which was started around September by Dr. Einar Gip.   Review of Systems As per HPI, otherwise negative  Current Medications, Allergies, Past Medical History, Past Surgical History, Family History and Social History were reviewed in [employer] record.     Objective:   Physical Exam There were no vitals taken for this visit. Virtual visit, no physical exam     Assessment & Plan:  59 year old female with a history of GERD with prior esophagitis, history of adenomatous colon polyp, prior exploratory laparotomy with concern for perforated gastric ulcer as seen by CT scan in July 2021 with negative exploration who is seen for follow-up  1.  Ongoing nausea/history of GERD/gastritis --her gastritis was chronic and there was no active component nor H. pylori infection.  There was no peptic ulcer disease.  There was no esophagitis.  She has been on twice daily pantoprazole with no improvement in her nausea symptoms and so I do not think this is acid peptic in nature.  I have recommended the following: --Reduce pantoprazole to 40 mg once daily --Trial of Carafate tablet or slurry 1  g 3 times daily before meals and at bedtime --I would like to asked Dr. Einar Gip if it is okay to hold the acebutolol to see if this is contributing to nausea.  Would only hold it for 1 to 2 weeks to make this determination (none of her other medications are new in the timeframe of her nausea) --if not better in 2 weeks, I have asked her to call me in that timeframe for an update, I would like to repeat cross-sectional imaging of the abdomen pelvis.  Other considerations include  HIDA scan to evaluate gallbladder function and possibly gastric emptying scan

## 2020-02-22 NOTE — Telephone Encounter (Signed)
I have spoken to patient to advise of Dr Vena Rua recommendation (with the blessing of Dr Einar Gip) to hold acebutolol x 10 days to see if this helps her nausea. I have also advised that she should let us know at the 10 day period how she is doing. She verbalizes understanding of this information.

## 2020-02-22 NOTE — Telephone Encounter (Signed)
Great, thanks to Dr. Einar Gip Please notify patient and ask she let me know 10 days without acebutolol If not change after hold period in symptoms then she can resume this med

## 2020-03-04 ENCOUNTER — Other Ambulatory Visit: Payer: Self-pay

## 2020-03-04 ENCOUNTER — Other Ambulatory Visit (INDEPENDENT_AMBULATORY_CARE_PROVIDER_SITE_OTHER): Payer: BC Managed Care – PPO

## 2020-03-04 ENCOUNTER — Telehealth: Payer: Self-pay | Admitting: Internal Medicine

## 2020-03-04 DIAGNOSIS — R110 Nausea: Secondary | ICD-10-CM

## 2020-03-04 LAB — BUN: BUN: 11 mg/dL (ref 6–23)

## 2020-03-04 LAB — CREATININE, SERUM: Creatinine, Ser: 0.64 mg/dL (ref 0.40–1.20)

## 2020-03-04 NOTE — Telephone Encounter (Signed)
Pt called back and states she is still having some nausea in the afternoon. Per note if pt not better she could resume cardiac med. Pt will start this again. OV note states next step would be CT of A/P. Pt scheduled for CT of A/P at [facility] 03/05/20@9am . Pt to be NPO after midnight except for bottle 1 of contrast at 7am, bottle 2 at 8am. Pt to come for labs today and to pick up contrast today. Pt aware and contrast left up front for pt to pick up.

## 2020-03-04 NOTE — Telephone Encounter (Signed)
Pt is requesting a call back from a nurse to discuss her medications, pt states the CARAFATE is helping her in the AM but feels nauseous midday. Pt would like to be switched to acebutolol if possible.

## 2020-03-05 ENCOUNTER — Other Ambulatory Visit: Payer: Self-pay

## 2020-03-05 ENCOUNTER — Ambulatory Visit (INDEPENDENT_AMBULATORY_CARE_PROVIDER_SITE_OTHER)
Admission: RE | Admit: 2020-03-05 | Discharge: 2020-03-05 | Disposition: A | Discharge status: Home / Self Care | Payer: BC Managed Care – PPO | Source: Ambulatory Visit | Attending: Internal Medicine | Admitting: Internal Medicine

## 2020-03-05 DIAGNOSIS — K219 Gastro-esophageal reflux disease without esophagitis: Secondary | ICD-10-CM | POA: Diagnosis not present

## 2020-03-05 DIAGNOSIS — R110 Nausea: Secondary | ICD-10-CM | POA: Diagnosis not present

## 2020-03-05 MED ORDER — IOHEXOL 300 MG/ML  SOLN
100.0000 mL | Freq: Once | INTRAMUSCULAR | Status: AC | PRN
  Administered 2020-03-05: 09:00:00 100 mL via INTRAVENOUS

## 2020-03-11 ENCOUNTER — Telehealth: Payer: Self-pay | Admitting: Internal Medicine

## 2020-03-11 NOTE — Telephone Encounter (Signed)
See result note.  

## 2020-03-11 NOTE — Telephone Encounter (Signed)
Inbound call from patient returning your call in regards to CT scan results. 

## 2020-06-28 ENCOUNTER — Other Ambulatory Visit: Payer: Self-pay | Admitting: *Deleted

## 2020-06-28 DIAGNOSIS — N6320 Unspecified lump in the left breast, unspecified quadrant: Secondary | ICD-10-CM

## 2020-10-07 IMAGING — CR DG CHEST 2V
2 series · 2 of 2 positions shown · non-contrast
Comparison: 04/08/2010

CLINICAL DATA: Preop lumbar surgery.

EXAM:
CHEST - 2 VIEW

[w chest pa]
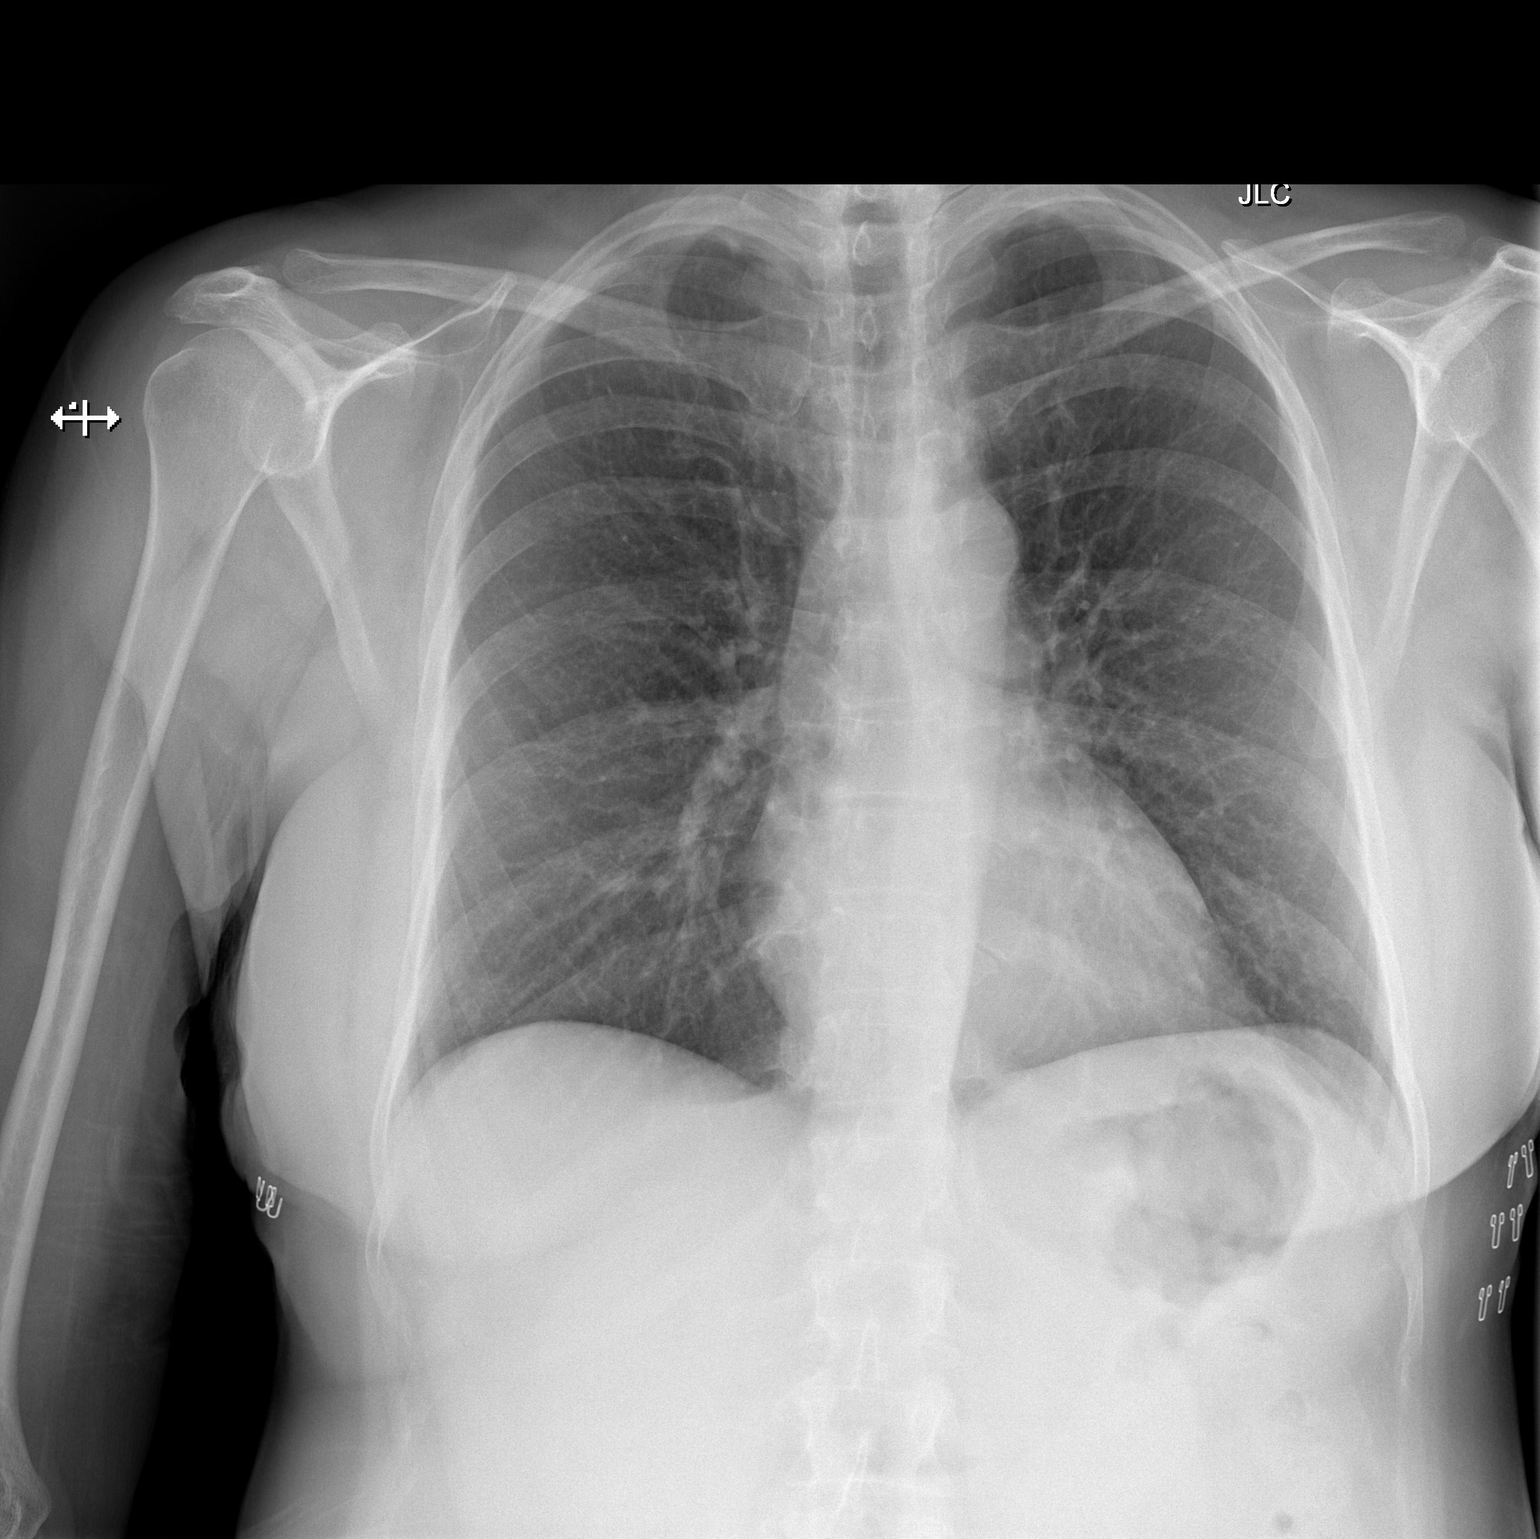

[w chest lat]
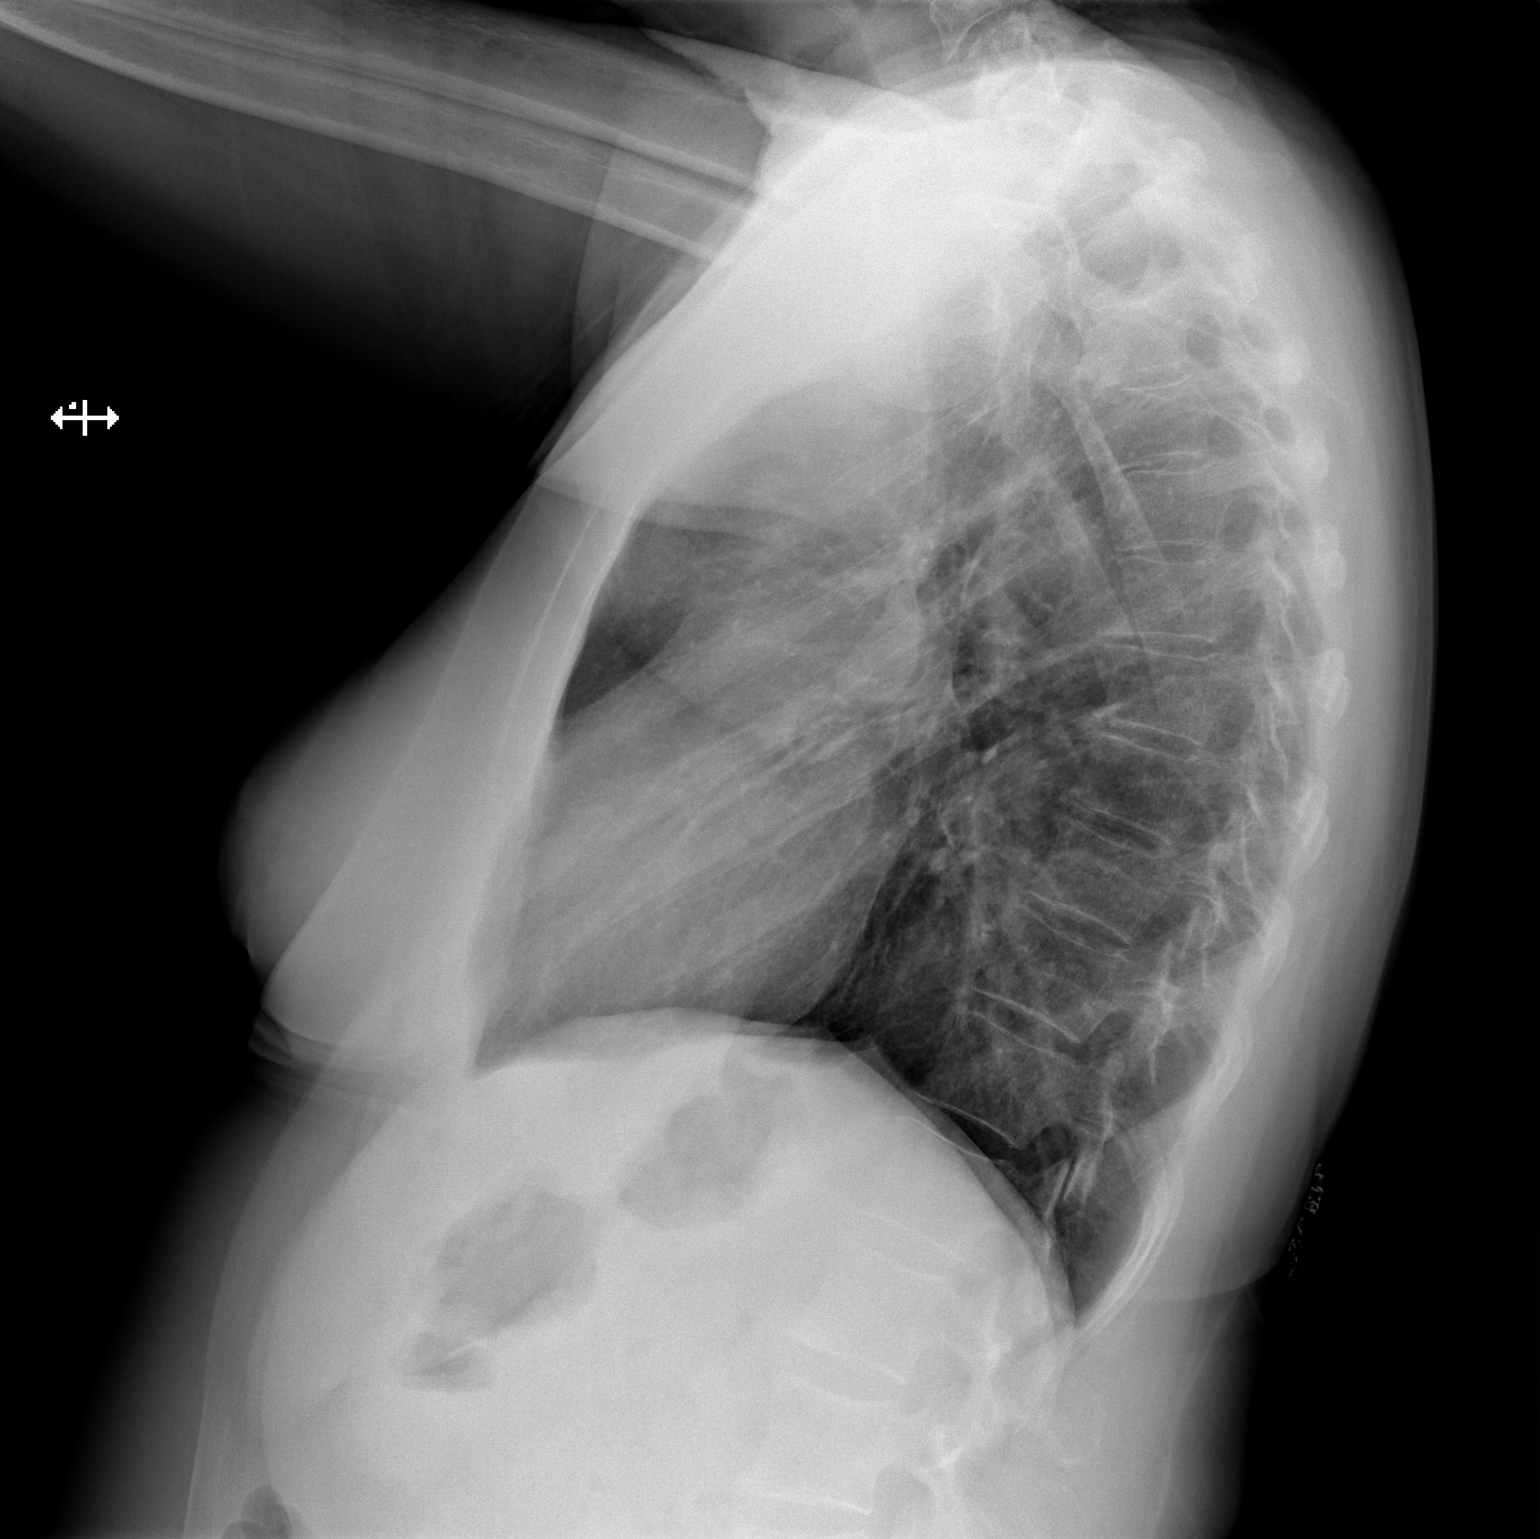

[2 of 2 positions shown; findings below may reference images not displayed]

FINDINGS: The cardiomediastinal contours are normal. The lungs are clear.
Pulmonary vasculature is normal. No consolidation, pleural effusion,
or pneumothorax. No acute osseous abnormalities are seen.
IMPRESSION: Unremarkable radiographs of the chest.

## 2020-11-27 ENCOUNTER — Other Ambulatory Visit: Payer: Self-pay | Admitting: Internal Medicine

## 2020-11-27 DIAGNOSIS — K219 Gastro-esophageal reflux disease without esophagitis: Secondary | ICD-10-CM

## 2020-12-21 ENCOUNTER — Other Ambulatory Visit: Payer: Self-pay | Admitting: Cardiology

## 2020-12-21 DIAGNOSIS — R002 Palpitations: Secondary | ICD-10-CM

## 2021-04-25 ENCOUNTER — Other Ambulatory Visit: Payer: Self-pay | Admitting: *Deleted

## 2021-04-25 DIAGNOSIS — M5416 Radiculopathy, lumbar region: Secondary | ICD-10-CM

## 2021-05-05 ENCOUNTER — Ambulatory Visit
Admission: RE | Admit: 2021-05-05 | Discharge: 2021-05-05 | Disposition: A | Discharge status: Home / Self Care | Payer: BC Managed Care – PPO | Source: Ambulatory Visit | Attending: *Deleted | Admitting: *Deleted

## 2021-05-05 DIAGNOSIS — M5416 Radiculopathy, lumbar region: Secondary | ICD-10-CM

## 2021-07-20 IMAGING — CT CT L SPINE W/O CM
1 of 5 series · 4 of 14 positions shown, 5 images · non-contrast
Comparison: CT of the lumbar spine October 25, 2019.

CLINICAL DATA: Degenerative disc disease.

EXAM:
CT LUMBAR SPINE WITHOUT CONTRAST
TECHNIQUE: Multidetector CT imaging of the lumbar spine was performed without
intravenous contrast administration. Multiplanar CT image
reconstructions were also generated.

[Series 3: l spine soft (person_name) · axial · 0.33mm/px · z∈[-231,-102]mm · 4 of 73 slices shown, 5 images]
[im 15/73  soft-tissue]
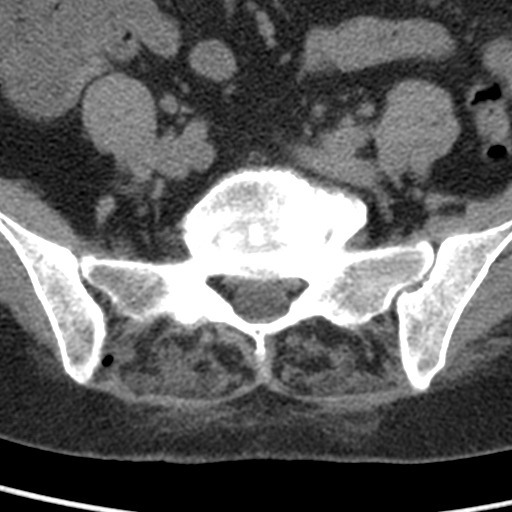
[im 15/73  bone]
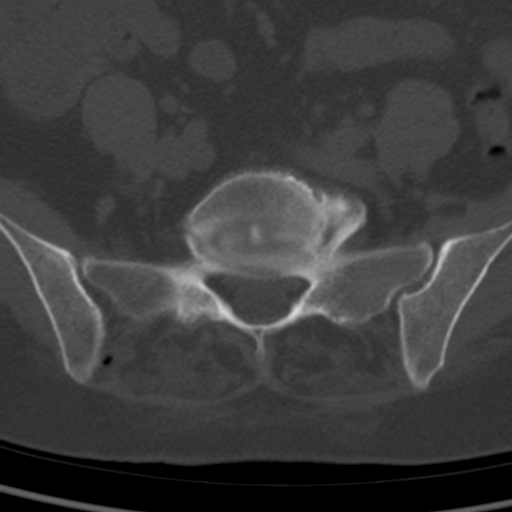
[im 29/73  bone]
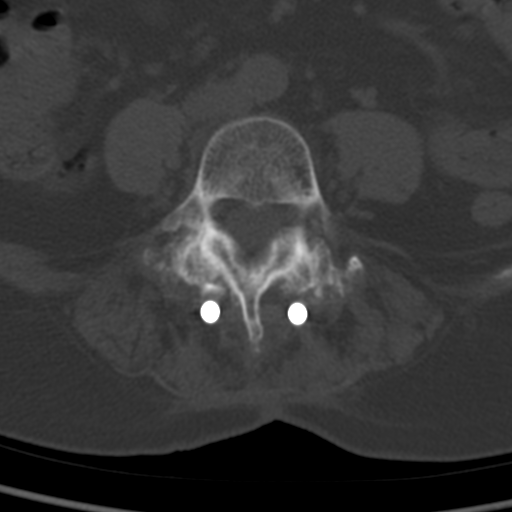
[im 44/73  bone]
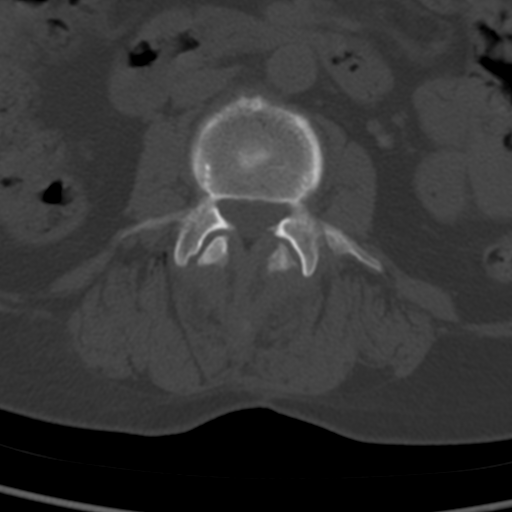
[im 58/73  bone]
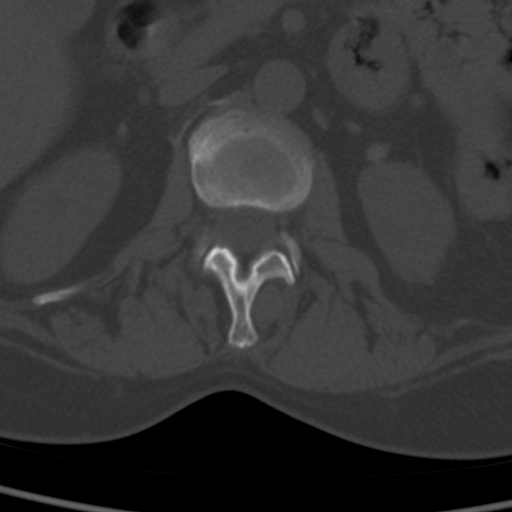

[4 of 14 positions shown; findings below may reference images not displayed]

FINDINGS: Segmentation: 5 lumbar type vertebrae.

Alignment: Trace anterolisthesis of L4 over L5.

Vertebrae: No acute fracture or focal pathologic process. Status
post posterior fusion with interlocking rods and transpedicular
screws at L3, L4 and L5 with intact hardware without evidence of
failure. L3-4 laminectomy.

Paraspinal and other soft tissues: Hyperdensity within the
prevertebral soft tissues at L2 related to prior discography.

Disc levels:

L1-2: No spinal canal or neural foraminal stenosis.

L2-3: Status post discography. Disc bulge and mild facet
degenerative. No significant spinal canal or neural foraminal
stenosis.

L3-4: Disc bulge and facet degenerative changes status post fusion,
resulting in mild bilateral neural foraminal narrowing. No
significant spinal canal stenosis.

L4-5: Disc bulge facet degenerative changes are status post fusion
resulting in mild spinal canal stenosis and mild right neural
foraminal narrowing.

L5-S1: Status post discography. Mild loss of disc height, shallow
disc bulge and advanced facet degenerative changes, right greater
than left without significant spinal canal or neural foraminal
stenosis.
IMPRESSION: 1. Status post posterior fusion with interlocking rods and
transpedicular screws at L3, L4 and L5 with intact hardware without
evidence of failure.
2. Mild multilevel degenerative changes of the lumbar spine, as
described above without high-grade spinal canal or neural foraminal
stenosis.

## 2021-10-02 ENCOUNTER — Other Ambulatory Visit: Payer: Self-pay | Admitting: Neurosurgery

## 2021-10-02 DIAGNOSIS — M5416 Radiculopathy, lumbar region: Secondary | ICD-10-CM

## 2021-10-23 ENCOUNTER — Ambulatory Visit
Admission: RE | Admit: 2021-10-23 | Discharge: 2021-10-23 | Disposition: A | Discharge status: Home / Self Care | Payer: BC Managed Care – PPO | Source: Ambulatory Visit | Attending: Neurosurgery | Admitting: Neurosurgery

## 2021-10-23 DIAGNOSIS — M5416 Radiculopathy, lumbar region: Secondary | ICD-10-CM

## 2021-10-23 MED ORDER — GADOBENATE DIMEGLUMINE 529 MG/ML IV SOLN
14.0000 mL | Freq: Once | INTRAVENOUS | Status: AC | PRN
  Administered 2021-10-23: 14 mL via INTRAVENOUS

## 2021-10-28 ENCOUNTER — Other Ambulatory Visit: Payer: Self-pay | Admitting: Neurological Surgery

## 2021-10-28 DIAGNOSIS — G894 Chronic pain syndrome: Secondary | ICD-10-CM

## 2021-10-31 ENCOUNTER — Ambulatory Visit
Admission: RE | Admit: 2021-10-31 | Discharge: 2021-10-31 | Disposition: A | Discharge status: Home / Self Care | Payer: BC Managed Care – PPO | Source: Ambulatory Visit | Attending: Neurological Surgery | Admitting: Neurological Surgery

## 2021-10-31 DIAGNOSIS — G894 Chronic pain syndrome: Secondary | ICD-10-CM

## 2021-10-31 MED ORDER — IOPAMIDOL (ISOVUE-M 200) INJECTION 41%
20.0000 mL | Freq: Once | INTRAMUSCULAR | Status: AC
  Administered 2021-10-31: 20 mL via INTRATHECAL

## 2021-10-31 MED ORDER — MEPERIDINE HCL 50 MG/ML IJ SOLN: 50.0000 mg | Freq: Once | INTRAMUSCULAR | Status: DC | PRN

## 2021-10-31 MED ORDER — ONDANSETRON HCL 4 MG/2ML IJ SOLN: 4.0000 mg | Freq: Once | INTRAMUSCULAR | Status: DC | PRN

## 2021-10-31 MED ORDER — DIAZEPAM 5 MG PO TABS
10.0000 mg | ORAL_TABLET | Freq: Once | ORAL | Status: AC
  Administered 2021-10-31: 5 mg via ORAL

## 2021-10-31 NOTE — Discharge Instructions (Signed)

## 2022-02-10 ENCOUNTER — Ambulatory Visit: Payer: Self-pay | Admitting: Podiatry

## 2022-02-24 ENCOUNTER — Ambulatory Visit (INDEPENDENT_AMBULATORY_CARE_PROVIDER_SITE_OTHER): Payer: BC Managed Care – PPO | Admitting: Podiatry

## 2022-02-24 ENCOUNTER — Ambulatory Visit (INDEPENDENT_AMBULATORY_CARE_PROVIDER_SITE_OTHER): Payer: BC Managed Care – PPO

## 2022-02-24 DIAGNOSIS — M722 Plantar fascial fibromatosis: Secondary | ICD-10-CM

## 2022-02-24 MED ORDER — MELOXICAM 15 MG PO TABS: 15.0000 mg | ORAL_TABLET | Freq: Every day | ORAL | 1 refills | Status: DC

## 2022-02-24 MED ORDER — BETAMETHASONE SOD PHOS & ACET 6 (3-3) MG/ML IJ SUSP
3.0000 mg | Freq: Once | INTRAMUSCULAR | Status: AC
  Administered 2022-02-24: 3 mg via INTRA_ARTICULAR

## 2022-02-24 NOTE — Progress Notes (Signed)
   Chief Complaint  Patient presents with   Foot Orthotics    Patient is here to get new orthotics.    Subjective: 61 y.o. female presenting as a reestablish new patient for evaluation of a new complaint regarding bilateral heel pain.  Patient states that her heels have been hurting over the past few months.  She does have a history of back surgery as well and bilateral forefoot surgery several years prior.  Orthotics in the past have helped significantly.  Presenting today for further treatment and evaluation   Past Medical History:  Diagnosis Date   Anxiety    Arthritis    Depression    Dysrhythmia    Dysuria 09/15/2016   Esophageal ulcer    Fibromyalgia    Gastritis    GERD (gastroesophageal reflux disease)    Hematuria, gross 09/15/2016   History of kidney stones    Hyperlipidemia    Menopause    AGE 53   Missed ab    NSVD (normal spontaneous vaginal delivery)    X2   Perforated peptic ulcer ([place])    Pneumonia    Pre-diabetes    Seasonal allergies    Sleep apnea    possible sleep apnea, was using a CPAP at one time, but not now     Objective: Physical Exam General: The patient is alert and oriented x3 in no acute distress.  Dermatology: Skin is warm, dry and supple bilateral lower extremities. Negative for open lesions or macerations bilateral.   Vascular: Dorsalis Pedis and Posterior Tibial pulses palpable bilateral.  Capillary fill time is immediate to all digits.  Neurological: Epicritic and protective threshold intact bilateral.   Musculoskeletal: Tenderness to palpation to the plantar aspect of the bilateral heels along the plantar fascia. All other joints range of motion within normal limits bilateral. Strength 5/5 in all groups bilateral.   Radiographic exam B/L feet 02/24/2022: Normal osseous mineralization.  Degenerative changes noted to the pedal joints bilateral.  Orthopedic hardware within the feet are intact and stable.  No acute fractures  identified  Assessment: 1. plantar fasciitis bilateral feet  Plan of Care:  1. Patient evaluated. Xrays reviewed.   2. Injection of 0.5cc Celestone soluspan injected into the bilateral heels.  3. Rx for Meloxicam ordered for patient. 4.  Appointment with orthotics department for custom molded orthotics fitting 5.  RTC as needed  Edrick Kins, DPM [facility]  Dr. Edrick Kins, DPM    [address], [place] [address]                Office [phone]  Fax [phone]

## 2022-02-25 ENCOUNTER — Ambulatory Visit (INDEPENDENT_AMBULATORY_CARE_PROVIDER_SITE_OTHER): Payer: BC Managed Care – PPO

## 2022-02-25 DIAGNOSIS — M722 Plantar fascial fibromatosis: Secondary | ICD-10-CM

## 2022-02-25 NOTE — Progress Notes (Signed)
Patient presents today to be casted for custom molded orthotics. Dr. Amalia Hailey has been treating patient for Plantar Fasciitis.   Impression foam cast was taken. ABN signed.  Patient info-  Shoe size: 7.5 Medium Women's  Shoe style: Dress  Height: 5'7"  Weight: 155 lbs  Insurance: [place]    Patient will be notified once orthotics arrive in office and reappoint for fitting at that time.

## 2022-04-08 ENCOUNTER — Ambulatory Visit (INDEPENDENT_AMBULATORY_CARE_PROVIDER_SITE_OTHER): Payer: BC Managed Care – PPO

## 2022-04-08 DIAGNOSIS — M722 Plantar fascial fibromatosis: Secondary | ICD-10-CM | POA: Diagnosis not present

## 2022-04-08 NOTE — Progress Notes (Signed)
Patient presents today to pick up custom molded foot orthotics, diagnosed with plantar fasciitis by Dr. Amalia Hailey.   Orthotics were dispensed and fit was satisfactory. Reviewed instructions for break-in and wear. Written instructions given to patient.  Patient will follow up as needed.   [facility] - order # [id number]

## 2022-06-23 ENCOUNTER — Encounter: Payer: BC Managed Care – PPO | Admitting: Internal Medicine

## 2022-07-27 ENCOUNTER — Other Ambulatory Visit: Payer: Self-pay | Admitting: Podiatry

## 2023-12-14 ENCOUNTER — Other Ambulatory Visit: Payer: Self-pay | Admitting: Neurological Surgery

## 2023-12-21 ENCOUNTER — Encounter (HOSPITAL_COMMUNITY): Payer: Self-pay

## 2023-12-21 NOTE — Pre-Procedure Instructions (Signed)
 Surgical Instructions   Your procedure is scheduled on January 07, 2024. Report to [facility] Main Entrance A at 11:30 A.M., then check in with the Admitting office. Any questions or running late day of surgery: call [phone]  Questions prior to your surgery date: call [phone], Monday-Friday, 8am-4pm. If you experience any cold or flu symptoms such as cough, fever, chills, shortness of breath, etc. between now and your scheduled surgery, please notify us  at the above number.     Remember:  Do not eat or drink after midnight the night before your surgery   Take these medicines the morning of surgery with A SIP OF WATER: acebutolol  (SECTRAL )  cromolyn (OPTICROM) ophthalmic solution  DULoxetine  (CYMBALTA )  pantoprazole  (PROTONIX )  Perfluorohexyloctane (MIEBO) eye drops sucralfate  (CARAFATE )    May take these medicines IF NEEDED: clonazePAM  (KLONOPIN ) diphenhydrAMINE  (BENADRYL )   valACYclovir (VALTREX)    One week prior to surgery, STOP taking any Aspirin  (unless otherwise instructed by your surgeon) Aleve, Naproxen, Ibuprofen, Motrin, Advil, Goody's, BC's, all herbal medications, fish oil, and non-prescription vitamins.                     Do NOT Smoke (Tobacco/Vaping) for 24 hours prior to your procedure.  If you use a CPAP at night, you may bring your mask/headgear for your overnight stay.   You will be asked to remove any contacts, glasses, piercing's, hearing aid's, dentures/partials prior to surgery. Please bring cases for these items if needed.    Patients discharged the day of surgery will not be allowed to drive home, and someone needs to stay with them for 24 hours.  SURGICAL WAITING ROOM VISITATION Patients may have no more than 2 support people in the waiting area - these visitors may rotate.   Pre-op nurse will coordinate an appropriate time for 1 ADULT support person, who may not rotate, to accompany patient in pre-op.  Children under the age of 52  must have an adult with them who is not the patient and must remain in the main waiting area with an adult.  If the patient needs to stay at the hospital during part of their recovery, the visitor guidelines for inpatient rooms apply.  Please refer to the [facility] website for the visitor guidelines for any additional information.   If you received a COVID test during your pre-op visit  it is requested that you wear a mask when out in public, stay away from anyone that may not be feeling well and notify your surgeon if you develop symptoms. If you have been in contact with anyone that has tested positive in the last 10 days please notify you surgeon.      Pre-operative 4 CHG Bathing Instructions   You can play a key role in reducing the risk of infection after surgery. Your skin needs to be as free of germs as possible. You can reduce the number of germs on your skin by washing with CHG (chlorhexidine  gluconate) soap before surgery. CHG is an antiseptic soap that kills germs and continues to kill germs even after washing.   DO NOT use if you have an allergy to chlorhexidine /CHG or antibacterial soaps. If your skin becomes reddened or irritated, stop using the CHG and notify one of our RNs at [phone].   Please shower with the CHG soap starting 4 days before surgery using the following schedule:     Please keep in mind the following:  DO NOT shave, including legs and  underarms, starting the day of your first shower.   You may shave your face at any point before/day of surgery.  Place clean sheets on your bed the day you start using CHG soap. Use a clean washcloth (not used since being washed) for each shower. DO NOT sleep with pets once you start using the CHG.   CHG Shower Instructions:  Wash your face and private area with normal soap. If you choose to wash your hair, wash first with your normal shampoo.  After you use shampoo/soap, rinse your hair and body thoroughly to remove  shampoo/soap residue.  Turn the water OFF and apply  bottle of CHG soap to a CLEAN washcloth.  Apply CHG soap ONLY FROM YOUR NECK DOWN TO YOUR TOES (washing for 3-5 minutes)  DO NOT use CHG soap on face, private areas, open wounds, or sores.  Pay special attention to the area where your surgery is being performed.  If you are having back surgery, having someone wash your back for you may be helpful. Wait 2 minutes after CHG soap is applied, then you may rinse off the CHG soap.  Pat dry with a clean towel  Put on clean clothes/pajamas   If you choose to wear lotion, please use ONLY the CHG-compatible lotions that are listed below.  Additional instructions for the day of surgery:  If you choose, you may shower the morning of surgery with an antibacterial soap.  DO NOT APPLY any lotions, deodorants, cologne, or perfumes.   Do not bring valuables to the hospital. [facility] is not responsible for any belongings/valuables. Do not wear nail polish, gel polish, artificial nails, or any other type of covering on natural nails (fingers and toes) Do not wear jewelry or makeup Put on clean/comfortable clothes.  Please brush your teeth.  Ask your nurse before applying any prescription medications to the skin.     CHG Compatible Lotions   Aveeno Moisturizing lotion  Cetaphil Moisturizing Cream  Cetaphil Moisturizing Lotion  Clairol Herbal Essence Moisturizing Lotion, Dry Skin  Clairol Herbal Essence Moisturizing Lotion, Extra Dry Skin  Clairol Herbal Essence Moisturizing Lotion, Normal Skin  Curel Age Defying Therapeutic Moisturizing Lotion with Alpha Hydroxy  Curel Extreme Care Body Lotion  Curel Soothing Hands Moisturizing Hand Lotion  Curel Therapeutic Moisturizing Cream, Fragrance-Free  Curel Therapeutic Moisturizing Lotion, Fragrance-Free  Curel Therapeutic Moisturizing Lotion, Original Formula  Eucerin Daily Replenishing Lotion  Eucerin Dry Skin Therapy Plus Alpha Hydroxy Crme   Eucerin Dry Skin Therapy Plus Alpha Hydroxy Lotion  Eucerin Original Crme  Eucerin Original Lotion  Eucerin Plus Crme Eucerin Plus Lotion  Eucerin TriLipid Replenishing Lotion  Keri Anti-Bacterial Hand Lotion  Keri Deep Conditioning Original Lotion Dry Skin Formula Softly Scented  Keri Deep Conditioning Original Lotion, Fragrance Free Sensitive Skin Formula  Keri Lotion Fast Absorbing Fragrance Free Sensitive Skin Formula  Keri Lotion Fast Absorbing Softly Scented Dry Skin Formula  Keri Original Lotion  Keri Skin Renewal Lotion Keri Silky Smooth Lotion  Keri Silky Smooth Sensitive Skin Lotion  Nivea Body Creamy Conditioning Oil  Nivea Body Extra Enriched Lotion  Nivea Body Original Lotion  Nivea Body Sheer Moisturizing Lotion Nivea Crme  Nivea Skin Firming Lotion  NutraDerm 30 Skin Lotion  NutraDerm Skin Lotion  NutraDerm Therapeutic Skin Cream  NutraDerm Therapeutic Skin Lotion  ProShield Protective Hand Cream  Provon moisturizing lotion  Please read over the following fact sheets that you were given.

## 2023-12-22 ENCOUNTER — Other Ambulatory Visit: Payer: Self-pay

## 2023-12-22 ENCOUNTER — Encounter (HOSPITAL_COMMUNITY): Payer: Self-pay

## 2023-12-22 ENCOUNTER — Encounter (HOSPITAL_COMMUNITY)
Admission: RE | Admit: 2023-12-22 | Discharge: 2023-12-22 | Disposition: A | Discharge status: Home / Self Care | Payer: Self-pay | Source: Ambulatory Visit | Attending: Neurological Surgery | Admitting: Neurological Surgery

## 2023-12-22 VITALS — BP 124/79 | HR 89 | Temp 98.3°F | Resp 16 | Ht 67.0 in | Wt 140.8 lb

## 2023-12-22 DIAGNOSIS — K279 Peptic ulcer, site unspecified, unspecified as acute or chronic, without hemorrhage or perforation: Secondary | ICD-10-CM | POA: Insufficient documentation

## 2023-12-22 DIAGNOSIS — Z87891 Personal history of nicotine dependence: Secondary | ICD-10-CM | POA: Diagnosis not present

## 2023-12-22 DIAGNOSIS — I071 Rheumatic tricuspid insufficiency: Secondary | ICD-10-CM | POA: Diagnosis not present

## 2023-12-22 DIAGNOSIS — I2510 Atherosclerotic heart disease of native coronary artery without angina pectoris: Secondary | ICD-10-CM | POA: Insufficient documentation

## 2023-12-22 DIAGNOSIS — Z01818 Encounter for other preprocedural examination: Secondary | ICD-10-CM | POA: Diagnosis present

## 2023-12-22 DIAGNOSIS — I447 Left bundle-branch block, unspecified: Secondary | ICD-10-CM | POA: Diagnosis not present

## 2023-12-22 LAB — BASIC METABOLIC PANEL WITH GFR
Anion gap: 11 (ref 5–15)
BUN: 10 mg/dL (ref 8–23)
CO2: 28 mmol/L (ref 22–32)
Calcium: 9.3 mg/dL (ref 8.9–10.3)
Chloride: 100 mmol/L (ref 98–111)
Creatinine, Ser: 0.67 mg/dL (ref 0.44–1.00)
GFR, Estimated: >60 mL/min (ref >60)
Glucose, Bld: 124 mg/dL — ABNORMAL HIGH (ref 70–99)
Potassium: 4.3 mmol/L (ref 3.5–5.1)
Sodium: 139 mmol/L (ref 135–145)

## 2023-12-22 LAB — TYPE AND SCREEN
ABO/RH(D): O POS
Antibody Screen: NEGATIVE

## 2023-12-22 LAB — CBC
HCT: 41.9 % (ref 36.0–46.0)
Hemoglobin: 13.8 g/dL (ref 12.0–15.0)
MCH: 30.5 pg (ref 26.0–34.0)
MCHC: 32.9 g/dL (ref 30.0–36.0)
MCV: 92.5 fL (ref 80.0–100.0)
Platelets: 340 K/uL (ref 150–400)
RBC: 4.53 MIL/uL (ref 3.87–5.11)
RDW: 13.2 % (ref 11.5–15.5)
WBC: 6.7 K/uL (ref 4.0–10.5)
nRBC: 0 % (ref 0.0–0.2)

## 2023-12-22 LAB — SURGICAL PCR SCREEN
MRSA, PCR: NEGATIVE
Staphylococcus aureus: NEGATIVE

## 2023-12-22 LAB — PROTIME-INR
INR: 0.9 (ref 0.8–1.2)
Prothrombin Time: 12.2 s (ref 11.4–15.2)

## 2023-12-22 NOTE — Progress Notes (Signed)
 PCP - Cristopher Iha, NP Cardiologist - Ladona Heinz, MD (follow-up as needed)  PPM/ICD - denies Device Orders - n/a Rep Notified - n/a  Chest x-ray - n/a EKG -12/22/2023  Stress Test - 10/25/2019 ECHO - 10/24/2019 Cardiac Cath - denies  Sleep Study - yes CPAP - no  Fasting Blood Sugar - no DM Checks Blood Sugar _____ times a day  Last dose of GLP1 agonist-  n/a GLP1 instructions: n/a  Blood Thinner Instructions: n/a Aspirin  Instructions: n/a  ERAS Protcol -no PRE-SURGERY Ensure or G2- n/a  COVID TEST- n/a   Anesthesia review: yes, cardiac tests in 2021, EKG review  Patient denies shortness of breath, fever, cough and chest pain at PAT appointment   All instructions explained to the patient, with a verbal understanding of the material. Patient agrees to go over the instructions while at home for a better understanding. Patient also instructed to self quarantine after being tested for COVID-19. The opportunity to ask questions was provided.

## 2023-12-23 NOTE — Progress Notes (Signed)
 Anesthesia Chart Review:  63 year old female with pertinent history including former smoker (quit 99), OSA not on CPAP, PUD on PPI, current left unremarkable.  History of chronic left bundle branch block with prior benign cardiology workup in 2021.  Echo 10/2019 showed LVEF 55 to 60%, normal RV, mild tricuspid regurgitation.  Nuclear stress 10/2019 was low risk, normal myocardial perfusion.  Preop labs reviewed, WNL.  EKG 12/22/2023: NSR.  Rate 81.  Left bundle branch block.  Lexiscan/modified Bruce Sestamibi stress test 10/25/2019: Lexiscan/modified Bruce nuclear stress test performed using 1-day protocol. Stress EKG is non-diagnostic, as this is pharmacological stress test. In addition, stress EKG at 97% MPHR showed sinus tachycardia, LBBB.  Normal myocardial perfusion. Stress LVEF 51%. Low risk study.  TTE 10/24/2019: 1. Left ventricular ejection fraction, by estimation, is 55 to 60%. The  left ventricle has normal function. The left ventricle has no regional  wall motion abnormalities. Left ventricular diastolic parameters are  indeterminate.   2. Right ventricular systolic function is normal. The right ventricular  size is normal.   3. Mild tricuspid regurgitation. EStimated PASP 28 mmHg.      Lynwood Geofm RIGGERS [facility] Short Stay Center/Anesthesiology Phone [phone] 12/23/2023 1:50 PM

## 2023-12-23 NOTE — Anesthesia Preprocedure Evaluation (Addendum)
 Anesthesia Evaluation  Patient identified by MRN, date of birth, ID band Patient awake    Reviewed: Allergy & Precautions, H&P , NPO status , Patient's Chart, lab work & pertinent test results  Airway Mallampati: II  TM Distance: >3 FB Neck ROM: Full    Dental no notable dental hx.    Pulmonary sleep apnea , former smoker   Pulmonary exam normal breath sounds clear to auscultation       Cardiovascular (-) hypertensionNormal cardiovascular exam Rhythm:Regular Rate:Normal  10/2019: IMPRESSIONS     1. Left ventricular ejection fraction, by estimation, is 55 to 60%. The  left ventricle has normal function. The left ventricle has no regional  wall motion abnormalities. Left ventricular diastolic parameters are  indeterminate.   2. Right ventricular systolic function is normal. The right ventricular  size is normal.   3. Mild tricuspid regurgitation. EStimated PASP 28 mmHg.     Neuro/Psych neg Seizures PSYCHIATRIC DISORDERS Anxiety Depression       GI/Hepatic Neg liver ROS, PUD,GERD  ,,  Endo/Other  negative endocrine ROS    Renal/GU negative Renal ROS  negative genitourinary   Musculoskeletal  (+) Arthritis ,  Fibromyalgia -  Abdominal   Peds negative pediatric ROS (+)  Hematology negative hematology ROS (+)   Anesthesia Other Findings   Reproductive/Obstetrics negative OB ROS                              Anesthesia Physical Anesthesia Plan  ASA: 3  Anesthesia Plan: General   Post-op Pain Management: Tylenol  PO (pre-op)*   Induction: Intravenous  PONV Risk Score and Plan: 3 and Ondansetron , Dexamethasone , Midazolam  and Treatment may vary due to age or medical condition  Airway Management Planned: Oral ETT  Additional Equipment: None  Intra-op Plan:   Post-operative Plan: Extubation in OR  Informed Consent: I have reviewed the patients History and Physical, chart, labs and  discussed the procedure including the risks, benefits and alternatives for the proposed anesthesia with the patient or authorized representative who has indicated his/her understanding and acceptance.       Plan Discussed with: CRNA  Anesthesia Plan Comments: (PAT note by Lynwood Hope, PA-C: 63 year old female with pertinent history including former smoker (quit 99), OSA not on CPAP, PUD on PPI, current left unremarkable.  History of chronic left bundle branch block with prior benign cardiology workup in 2021.  Echo 10/2019 showed LVEF 55 to 60%, normal RV, mild tricuspid regurgitation.  Nuclear stress 10/2019 was low risk, normal myocardial perfusion.  Preop labs reviewed, WNL.  EKG 12/22/2023: NSR.  Rate 81.  Left bundle branch block.  Lexiscan/modified Bruce Sestamibi stress test 10/25/2019: Lexiscan/modified Bruce nuclear stress test performed using 1-day protocol. Stress EKG is non-diagnostic, as this is pharmacological stress test. In addition, stress EKG at 97% MPHR showed sinus tachycardia, LBBB.  Normal myocardial perfusion. Stress LVEF 51%. Low risk study.  TTE 10/24/2019: 1. Left ventricular ejection fraction, by estimation, is 55 to 60%. The  left ventricle has normal function. The left ventricle has no regional  wall motion abnormalities. Left ventricular diastolic parameters are  indeterminate.  2. Right ventricular systolic function is normal. The right ventricular  size is normal.  3. Mild tricuspid regurgitation. EStimated PASP 28 mmHg.   )         Anesthesia Quick Evaluation

## 2024-01-07 ENCOUNTER — Ambulatory Visit (HOSPITAL_COMMUNITY)

## 2024-01-07 ENCOUNTER — Other Ambulatory Visit: Payer: Self-pay

## 2024-01-07 ENCOUNTER — Encounter (HOSPITAL_COMMUNITY): Payer: Self-pay | Admitting: Neurological Surgery

## 2024-01-07 ENCOUNTER — Observation Stay (HOSPITAL_COMMUNITY)
Admission: RE | Admit: 2024-01-07 | Discharge: 2024-01-08 | Disposition: A | Discharge status: Home / Self Care | Payer: Self-pay | Source: Ambulatory Visit | Attending: Neurological Surgery | Admitting: Neurological Surgery

## 2024-01-07 ENCOUNTER — Encounter (HOSPITAL_COMMUNITY)
Admission: RE | Disposition: A | Discharge status: Home / Self Care | Payer: Self-pay | Source: Ambulatory Visit | Attending: Neurological Surgery

## 2024-01-07 ENCOUNTER — Ambulatory Visit (HOSPITAL_COMMUNITY): Payer: Self-pay | Admitting: Physician Assistant

## 2024-01-07 ENCOUNTER — Ambulatory Visit (HOSPITAL_COMMUNITY): Payer: Self-pay | Admitting: Anesthesiology

## 2024-01-07 DIAGNOSIS — M48061 Spinal stenosis, lumbar region without neurogenic claudication: Principal | ICD-10-CM | POA: Insufficient documentation

## 2024-01-07 DIAGNOSIS — Z87891 Personal history of nicotine dependence: Secondary | ICD-10-CM | POA: Diagnosis not present

## 2024-01-07 DIAGNOSIS — F1090 Alcohol use, unspecified, uncomplicated: Secondary | ICD-10-CM | POA: Diagnosis not present

## 2024-01-07 DIAGNOSIS — M47896 Other spondylosis, lumbar region: Secondary | ICD-10-CM | POA: Diagnosis not present

## 2024-01-07 DIAGNOSIS — I2510 Atherosclerotic heart disease of native coronary artery without angina pectoris: Secondary | ICD-10-CM

## 2024-01-07 DIAGNOSIS — E7800 Pure hypercholesterolemia, unspecified: Secondary | ICD-10-CM

## 2024-01-07 DIAGNOSIS — F418 Other specified anxiety disorders: Secondary | ICD-10-CM | POA: Diagnosis not present

## 2024-01-07 DIAGNOSIS — M51369 Other intervertebral disc degeneration, lumbar region without mention of lumbar back pain or lower extremity pain: Secondary | ICD-10-CM | POA: Insufficient documentation

## 2024-01-07 DIAGNOSIS — Z981 Arthrodesis status: Secondary | ICD-10-CM | POA: Diagnosis not present

## 2024-01-07 HISTORY — PX: FORAMINOTOMY 1 LEVEL: SHX5835

## 2024-01-07 LAB — TYPE AND SCREEN
ABO/RH(D): O POS
Antibody Screen: NEGATIVE

## 2024-01-07 SURGERY — FORAMINOTOMY 1 LEVEL
Anesthesia: General | Laterality: Bilateral

## 2024-01-07 MED ORDER — MELATONIN 5 MG PO TABS
10.0000 mg | ORAL_TABLET | Freq: Every day | ORAL | Status: DC
  Filled 2024-01-07: qty 2

## 2024-01-07 MED ORDER — LACTATED RINGERS IV SOLN: INTRAVENOUS | Status: DC

## 2024-01-07 MED ORDER — HYDROMORPHONE HCL 1 MG/ML IJ SOLN
INTRAMUSCULAR | Status: DC | PRN
  Administered 2024-01-07: .5 mg via INTRAVENOUS

## 2024-01-07 MED ORDER — SUGAMMADEX SODIUM 200 MG/2ML IV SOLN
INTRAVENOUS | Status: DC | PRN
  Administered 2024-01-07: 160 mg via INTRAVENOUS

## 2024-01-07 MED ORDER — PROPOFOL 10 MG/ML IV BOLUS
INTRAVENOUS | Status: AC
  Filled 2024-01-07: qty 20

## 2024-01-07 MED ORDER — OXYCODONE HCL 5 MG PO TABS
10.0000 mg | ORAL_TABLET | ORAL | Status: DC | PRN
  Administered 2024-01-07 – 2024-01-08 (×5): 10 mg via ORAL
  Filled 2024-01-07 (×5): qty 2

## 2024-01-07 MED ORDER — SURGIRINSE WOUND IRRIGATION SYSTEM - OPTIME
TOPICAL | Status: DC | PRN
  Administered 2024-01-07: 450 mL via TOPICAL

## 2024-01-07 MED ORDER — ONDANSETRON HCL 4 MG PO TABS: 4.0000 mg | ORAL_TABLET | Freq: Four times a day (QID) | ORAL | Status: DC | PRN

## 2024-01-07 MED ORDER — DULOXETINE HCL 60 MG PO CPEP
60.0000 mg | ORAL_CAPSULE | Freq: Every day | ORAL | Status: DC
  Filled 2024-01-07: qty 1

## 2024-01-07 MED ORDER — THROMBIN 5000 UNITS EX SOLR
OROMUCOSAL | Status: DC | PRN
  Administered 2024-01-07: 5 mL via TOPICAL

## 2024-01-07 MED ORDER — ROCURONIUM BROMIDE 10 MG/ML (PF) SYRINGE
PREFILLED_SYRINGE | INTRAVENOUS | Status: DC | PRN
  Administered 2024-01-07: 50 mg via INTRAVENOUS
  Administered 2024-01-07: 30 mg via INTRAVENOUS
  Administered 2024-01-07: 20 mg via INTRAVENOUS

## 2024-01-07 MED ORDER — CHLORHEXIDINE GLUCONATE CLOTH 2 % EX PADS
6.0000 | MEDICATED_PAD | Freq: Once | CUTANEOUS | Status: DC
Start: 2024-01-07 — End: 2024-01-07

## 2024-01-07 MED ORDER — GABAPENTIN 300 MG PO CAPS
300.0000 mg | ORAL_CAPSULE | ORAL | Status: AC
  Administered 2024-01-07: 300 mg via ORAL
  Filled 2024-01-07: qty 1

## 2024-01-07 MED ORDER — CLONAZEPAM 0.5 MG PO TABS
0.5000 mg | ORAL_TABLET | Freq: Two times a day (BID) | ORAL | Status: DC | PRN
  Administered 2024-01-07: 1 mg via ORAL
  Filled 2024-01-07 (×2): qty 2

## 2024-01-07 MED ORDER — LIDOCAINE 2% (20 MG/ML) 5 ML SYRINGE
INTRAMUSCULAR | Status: AC
  Filled 2024-01-07: qty 5

## 2024-01-07 MED ORDER — DROPERIDOL 2.5 MG/ML IJ SOLN: 0.6250 mg | Freq: Once | INTRAMUSCULAR | Status: DC | PRN

## 2024-01-07 MED ORDER — SODIUM CHLORIDE 0.9 % IV SOLN
250.0000 mL | INTRAVENOUS | Status: DC
  Administered 2024-01-07: 250 mL via INTRAVENOUS

## 2024-01-07 MED ORDER — MIDAZOLAM HCL (PF) 2 MG/2ML IJ SOLN
INTRAMUSCULAR | Status: DC | PRN
  Administered 2024-01-07: 2 mg via INTRAVENOUS

## 2024-01-07 MED ORDER — HYDROMORPHONE HCL 1 MG/ML IJ SOLN
INTRAMUSCULAR | Status: AC
  Filled 2024-01-07: qty 0.5

## 2024-01-07 MED ORDER — CELECOXIB 200 MG PO CAPS
200.0000 mg | ORAL_CAPSULE | Freq: Two times a day (BID) | ORAL | Status: DC
  Administered 2024-01-08: 200 mg via ORAL
  Filled 2024-01-07 (×2): qty 1

## 2024-01-07 MED ORDER — OXYCODONE HCL 5 MG PO TABS: 5.0000 mg | ORAL_TABLET | ORAL | Status: DC | PRN

## 2024-01-07 MED ORDER — FENTANYL CITRATE (PF) 250 MCG/5ML IJ SOLN
INTRAMUSCULAR | Status: AC
  Filled 2024-01-07: qty 5

## 2024-01-07 MED ORDER — MIDAZOLAM HCL 2 MG/2ML IJ SOLN
INTRAMUSCULAR | Status: AC
Start: 2024-01-07 — End: 2024-01-07
  Filled 2024-01-07: qty 2

## 2024-01-07 MED ORDER — HYDROMORPHONE HCL 1 MG/ML IJ SOLN
0.5000 mg | INTRAMUSCULAR | Status: DC | PRN
  Administered 2024-01-07: 0.5 mg via INTRAVENOUS
  Filled 2024-01-07: qty 0.5

## 2024-01-07 MED ORDER — BUPIVACAINE HCL (PF) 0.25 % IJ SOLN
INTRAMUSCULAR | Status: AC
Start: 2024-01-07 — End: 2024-01-07
  Filled 2024-01-07: qty 30

## 2024-01-07 MED ORDER — SENNA 8.6 MG PO TABS
1.0000 | ORAL_TABLET | Freq: Two times a day (BID) | ORAL | Status: DC
  Administered 2024-01-07 – 2024-01-08 (×2): 8.6 mg via ORAL
  Filled 2024-01-07 (×2): qty 1

## 2024-01-07 MED ORDER — OXYCODONE HCL 5 MG/5ML PO SOLN: 5.0000 mg | Freq: Once | ORAL | Status: DC | PRN

## 2024-01-07 MED ORDER — MAGNESIUM OXIDE -MG SUPPLEMENT 400 (240 MG) MG PO TABS
400.0000 mg | ORAL_TABLET | Freq: Every day | ORAL | Status: DC
Start: 2024-01-08 — End: 2024-01-08
  Filled 2024-01-07: qty 1

## 2024-01-07 MED ORDER — FENTANYL CITRATE (PF) 100 MCG/2ML IJ SOLN
INTRAMUSCULAR | Status: AC
  Filled 2024-01-07: qty 2

## 2024-01-07 MED ORDER — CEFAZOLIN SODIUM-DEXTROSE 2-4 GM/100ML-% IV SOLN
2.0000 g | Freq: Three times a day (TID) | INTRAVENOUS | Status: AC
  Administered 2024-01-07 – 2024-01-08 (×2): 2 g via INTRAVENOUS
  Filled 2024-01-07 (×2): qty 100

## 2024-01-07 MED ORDER — ONDANSETRON HCL 4 MG/2ML IJ SOLN
INTRAMUSCULAR | Status: DC | PRN
  Administered 2024-01-07: 4 mg via INTRAVENOUS

## 2024-01-07 MED ORDER — CROMOLYN SODIUM 4 % OP SOLN
1.0000 [drp] | Freq: Three times a day (TID) | OPHTHALMIC | Status: DC
  Filled 2024-01-07: qty 10

## 2024-01-07 MED ORDER — PROPOFOL 10 MG/ML IV BOLUS
INTRAVENOUS | Status: DC | PRN
  Administered 2024-01-07: 120 mg via INTRAVENOUS

## 2024-01-07 MED ORDER — OXYCODONE HCL 5 MG PO TABS: 5.0000 mg | ORAL_TABLET | Freq: Once | ORAL | Status: DC | PRN

## 2024-01-07 MED ORDER — CHLORHEXIDINE GLUCONATE CLOTH 2 % EX PADS: 6.0000 | MEDICATED_PAD | Freq: Once | CUTANEOUS | Status: DC

## 2024-01-07 MED ORDER — METHOCARBAMOL 1000 MG/10ML IJ SOLN: 500.0000 mg | Freq: Four times a day (QID) | INTRAMUSCULAR | Status: DC | PRN

## 2024-01-07 MED ORDER — ONDANSETRON HCL 4 MG/2ML IJ SOLN: 4.0000 mg | Freq: Four times a day (QID) | INTRAMUSCULAR | Status: DC | PRN

## 2024-01-07 MED ORDER — DEXAMETHASONE SOD PHOSPHATE PF 10 MG/ML IJ SOLN
INTRAMUSCULAR | Status: DC | PRN
  Administered 2024-01-07: 10 mg via INTRAVENOUS

## 2024-01-07 MED ORDER — ONDANSETRON HCL 4 MG/2ML IJ SOLN
INTRAMUSCULAR | Status: AC
  Filled 2024-01-07: qty 2

## 2024-01-07 MED ORDER — SUCRALFATE 1 G PO TABS
1.0000 g | ORAL_TABLET | Freq: Three times a day (TID) | ORAL | Status: DC
  Administered 2024-01-08: 1 g via ORAL
  Filled 2024-01-07 (×5): qty 1

## 2024-01-07 MED ORDER — POLYVINYL ALCOHOL 1.4 % OP SOLN
1.0000 [drp] | Freq: Two times a day (BID) | OPHTHALMIC | Status: DC
  Filled 2024-01-07: qty 15

## 2024-01-07 MED ORDER — ACETAMINOPHEN 500 MG PO TABS
1000.0000 mg | ORAL_TABLET | ORAL | Status: AC
  Administered 2024-01-07: 1000 mg via ORAL
  Filled 2024-01-07: qty 2

## 2024-01-07 MED ORDER — ACETAMINOPHEN 500 MG PO TABS
1000.0000 mg | ORAL_TABLET | Freq: Four times a day (QID) | ORAL | Status: DC
  Administered 2024-01-07 – 2024-01-08 (×3): 1000 mg via ORAL
  Filled 2024-01-07 (×3): qty 2

## 2024-01-07 MED ORDER — FENTANYL CITRATE (PF) 100 MCG/2ML IJ SOLN
25.0000 ug | INTRAMUSCULAR | Status: DC | PRN
  Administered 2024-01-07 (×4): 25 ug via INTRAVENOUS

## 2024-01-07 MED ORDER — PANTOPRAZOLE SODIUM 40 MG PO TBEC
40.0000 mg | DELAYED_RELEASE_TABLET | Freq: Every day | ORAL | Status: DC
Start: 2024-01-08 — End: 2024-01-08
  Filled 2024-01-07: qty 1

## 2024-01-07 MED ORDER — CEFAZOLIN SODIUM-DEXTROSE 2-4 GM/100ML-% IV SOLN
2.0000 g | INTRAVENOUS | Status: AC
  Administered 2024-01-07: 2 g via INTRAVENOUS
  Filled 2024-01-07: qty 100

## 2024-01-07 MED ORDER — METHOCARBAMOL 500 MG PO TABS
500.0000 mg | ORAL_TABLET | Freq: Four times a day (QID) | ORAL | Status: DC | PRN
  Administered 2024-01-07 – 2024-01-08 (×3): 500 mg via ORAL
  Filled 2024-01-07 (×3): qty 1

## 2024-01-07 MED ORDER — FENTANYL CITRATE (PF) 250 MCG/5ML IJ SOLN
INTRAMUSCULAR | Status: DC | PRN
  Administered 2024-01-07: 50 ug via INTRAVENOUS
  Administered 2024-01-07 (×2): 100 ug via INTRAVENOUS

## 2024-01-07 MED ORDER — PHENOL 1.4 % MT LIQD: 1.0000 | OROMUCOSAL | Status: DC | PRN

## 2024-01-07 MED ORDER — POTASSIUM CHLORIDE IN NACL 20-0.9 MEQ/L-% IV SOLN
INTRAVENOUS | Status: DC
  Filled 2024-01-07: qty 1000

## 2024-01-07 MED ORDER — BUPIVACAINE HCL (PF) 0.25 % IJ SOLN
INTRAMUSCULAR | Status: DC | PRN
  Administered 2024-01-07: 10 mL

## 2024-01-07 MED ORDER — ACETAMINOPHEN 10 MG/ML IV SOLN: 1000.0000 mg | Freq: Once | INTRAVENOUS | Status: DC | PRN

## 2024-01-07 MED ORDER — MENTHOL 3 MG MT LOZG: 1.0000 | LOZENGE | OROMUCOSAL | Status: DC | PRN

## 2024-01-07 MED ORDER — THROMBIN 20000 UNITS EX SOLR
CUTANEOUS | Status: AC
  Filled 2024-01-07: qty 20000

## 2024-01-07 MED ORDER — SODIUM CHLORIDE 0.9% FLUSH: 3.0000 mL | INTRAVENOUS | Status: DC | PRN

## 2024-01-07 MED ORDER — ORAL CARE MOUTH RINSE: 15.0000 mL | Freq: Once | OROMUCOSAL | Status: AC

## 2024-01-07 MED ORDER — PHENYLEPHRINE HCL-NACL 20-0.9 MG/250ML-% IV SOLN
INTRAVENOUS | Status: DC | PRN
Start: 2024-01-07 — End: 2024-01-07
  Administered 2024-01-07: 20 ug/min via INTRAVENOUS

## 2024-01-07 MED ORDER — DIPHENHYDRAMINE HCL 25 MG PO CAPS: 25.0000 mg | ORAL_CAPSULE | Freq: Four times a day (QID) | ORAL | Status: DC | PRN

## 2024-01-07 MED ORDER — THROMBIN 5000 UNITS EX KIT
PACK | CUTANEOUS | Status: AC
  Filled 2024-01-07: qty 1

## 2024-01-07 MED ORDER — CHLORHEXIDINE GLUCONATE 0.12 % MT SOLN
15.0000 mL | Freq: Once | OROMUCOSAL | Status: AC
  Administered 2024-01-07: 15 mL via OROMUCOSAL
  Filled 2024-01-07: qty 15

## 2024-01-07 MED ORDER — ACEBUTOLOL HCL 200 MG PO CAPS
200.0000 mg | ORAL_CAPSULE | Freq: Two times a day (BID) | ORAL | Status: DC
  Administered 2024-01-07: 200 mg via ORAL
  Filled 2024-01-07 (×3): qty 1

## 2024-01-07 MED ORDER — ROCURONIUM BROMIDE 10 MG/ML (PF) SYRINGE
PREFILLED_SYRINGE | INTRAVENOUS | Status: AC
  Filled 2024-01-07: qty 10

## 2024-01-07 MED ORDER — PROPOFOL 10 MG/ML IV BOLUS
INTRAVENOUS | Status: AC
Start: 2024-01-07 — End: 2024-01-07
  Filled 2024-01-07: qty 20

## 2024-01-07 MED ORDER — 0.9 % SODIUM CHLORIDE (POUR BTL) OPTIME
TOPICAL | Status: DC | PRN
  Administered 2024-01-07: 1000 mL

## 2024-01-07 MED ORDER — SODIUM CHLORIDE 0.9% FLUSH
3.0000 mL | Freq: Two times a day (BID) | INTRAVENOUS | Status: DC
  Administered 2024-01-07: 3 mL via INTRAVENOUS

## 2024-01-07 MED ORDER — LIDOCAINE 2% (20 MG/ML) 5 ML SYRINGE
INTRAMUSCULAR | Status: DC | PRN
  Administered 2024-01-07: 60 mg via INTRAVENOUS

## 2024-01-07 SURGICAL SUPPLY — 54 items
ALLOGRAFT BONE FIBER KORE 5 (Bone Implant) IMPLANT
BAG COUNTER SPONGE SURGICOUNT (BAG) ×2 IMPLANT
BAND RUBBER #18 3X1/16 STRL (MISCELLANEOUS) ×2 IMPLANT
BASKET BONE COLLECTION (BASKET) ×1 IMPLANT
BENZOIN TINCTURE PRP APPL 2/3 (GAUZE/BANDAGES/DRESSINGS) ×2 IMPLANT
BLADE BONE MILL MEDIUM (MISCELLANEOUS) ×1 IMPLANT
BLADE CLIPPER SURG (BLADE) IMPLANT
BUR CARBIDE MATCH 3.0 (BURR) ×2 IMPLANT
CANISTER SUCTION 3000ML PPV (SUCTIONS) ×2 IMPLANT
CNTNR URN SCR LID CUP LEK RST (MISCELLANEOUS) ×1 IMPLANT
COVER BACK TABLE 60X90IN (DRAPES) ×1 IMPLANT
DRAPE C-ARM 42X72 X-RAY (DRAPES) ×2 IMPLANT
DRAPE C-ARMOR (DRAPES) ×1 IMPLANT
DRAPE LAPAROTOMY 100X72X124 (DRAPES) ×2 IMPLANT
DRAPE MICROSCOPE SLANT 54X150 (MISCELLANEOUS) ×1 IMPLANT
DRAPE SURG 17X23 STRL (DRAPES) ×2 IMPLANT
DRSG AQUACEL AG ADV 3.5X 6 (GAUZE/BANDAGES/DRESSINGS) ×3 IMPLANT
DURAPREP 26ML APPLICATOR (WOUND CARE) ×2 IMPLANT
ELECTRODE REM PT RTRN 9FT ADLT (ELECTROSURGICAL) ×2 IMPLANT
EVACUATOR 1/8 PVC DRAIN (DRAIN) ×1 IMPLANT
GAUZE 4X4 16PLY ~~LOC~~+RFID DBL (SPONGE) IMPLANT
GLOVE BIO SURGEON STRL SZ7 (GLOVE) IMPLANT
GLOVE BIO SURGEON STRL SZ8 (GLOVE) ×3 IMPLANT
GLOVE BIOGEL PI IND STRL 7.0 (GLOVE) IMPLANT
GLOVE BIOGEL PI IND STRL 7.5 (GLOVE) IMPLANT
GOWN STRL REUS W/ TWL LRG LVL3 (GOWN DISPOSABLE) IMPLANT
GOWN STRL REUS W/ TWL XL LVL3 (GOWN DISPOSABLE) ×3 IMPLANT
GOWN STRL REUS W/TWL 2XL LVL3 (GOWN DISPOSABLE) IMPLANT
HEMOSTAT POWDER KIT SURGIFOAM (HEMOSTASIS) ×2 IMPLANT
KIT BASIN OR (CUSTOM PROCEDURE TRAY) ×2 IMPLANT
KIT TURNOVER KIT B (KITS) ×2 IMPLANT
MILL BONE PREP (MISCELLANEOUS) ×1 IMPLANT
NDL HYPO 25X1 1.5 SAFETY (NEEDLE) ×2 IMPLANT
NDL SPNL 20GX3.5 QUINCKE YW (NEEDLE) IMPLANT
NEEDLE HYPO 25X1 1.5 SAFETY (NEEDLE) ×2 IMPLANT
NEEDLE SPNL 20GX3.5 QUINCKE YW (NEEDLE) IMPLANT
PACK LAMINECTOMY NEURO (CUSTOM PROCEDURE TRAY) ×2 IMPLANT
PAD ARMBOARD POSITIONER FOAM (MISCELLANEOUS) ×6 IMPLANT
ROD LORD LIPPED TI 5.5X45 (Rod) IMPLANT
SCREW POLYAXIAL TULIP (Screw) IMPLANT
SCREW SHANK MOD 5.5X40 (Screw) IMPLANT
SET SCREW SPNE (Screw) IMPLANT
SOLN 0.9% NACL POUR BTL 1000ML (IV SOLUTION) ×2 IMPLANT
SOLN STERILE WATER BTL 1000 ML (IV SOLUTION) ×2 IMPLANT
SOLUTION IRRIG SURGIPHOR (IV SOLUTION) ×1 IMPLANT
SPACER IDENTITI PS 5D 8X9X25 (Spacer) IMPLANT
SPONGE SURGIFOAM ABS GEL 100 (HEMOSTASIS) ×1 IMPLANT
SPONGE T-LAP 4X18 ~~LOC~~+RFID (SPONGE) IMPLANT
STRIP CLOSURE SKIN 1/2X4 (GAUZE/BANDAGES/DRESSINGS) ×2 IMPLANT
SUT VIC AB 0 CT1 18XCR BRD8 (SUTURE) ×2 IMPLANT
SUT VIC AB 2-0 CP2 18 (SUTURE) ×2 IMPLANT
SUT VIC AB 3-0 SH 8-18 (SUTURE) ×3 IMPLANT
TOWEL GREEN STERILE (TOWEL DISPOSABLE) ×2 IMPLANT
TOWEL GREEN STERILE FF (TOWEL DISPOSABLE) ×2 IMPLANT

## 2024-01-07 NOTE — Progress Notes (Signed)
 Orthopedic Tech Progress Note Patient Details:  Breanna Palmer 1960/04/11 [id number]  Ortho Devices Type of Ortho Device: Lumbar corsett Ortho Device/Splint Location: Left at patient bedside (patient was aware) Ortho Device/Splint Interventions: Rosa Rouse F Alyxis Grippi 01/07/2024, 5:34 PM

## 2024-01-07 NOTE — Op Note (Signed)
 01/07/2024  1:12 PM  PATIENT:  Breanna Palmer  63 y.o. 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 Decompressive lumbar laminectomy, hemi facetectomy and foraminotomies L2-3 requiring more work than would be required for a simple exposure of the disk for PLIF in order to adequately decompress the neural elements and address the spinal stenosis 2. Posterior lumbar interbody fusion L2-3 using PTI interbody cages packed with morcellized allograft and autograft  3. Posterior fixation L2-3 using ATEC cortical pedicle screws.  4. Intertransverse arthrodesis L2-3 using morcellized autograft and allograft. 5.  Removal of segmental fixation L3-L5  SURGEON:  Alm Molt, MD  ASSISTANTS: Suzen Pean, FNP  ANESTHESIA:  General  EBL: 100 ml  Total I/O In: 1800 [I.V.:1800] Out: 100 [Blood:100]  BLOOD ADMINISTERED:none  DRAINS: none   INDICATION FOR PROCEDURE: This patient presented with back pain with bilateral leg pain with ambulation. Imaging revealed adjacent segment degeneration L2-3 with spinal stenosis above her previous successful L3-L5 fusion. The patient tried a reasonable attempt at conservative medical measures without relief. I recommended decompression and instrumented fusion to address the stenosis as well as the segmental  instability.    Unfortunately, her insurance company would not authorize a fusion at the adjacent level despite the fact that that is the typical standard procedure in this region for adjacent segment degeneration.  Therefore, I called and had a peer to peer conference with one of their physicians.  His concern was that, with the narrow lamina and the wide canal at L2-3, it would likely require a fairly wide laminectomy to perform adequate decompression of the neural elements, and that may  destabilize the segment and may require fusion at the time of surgery.  I was in agreement.  He stated he could not authorize the fusion preoperatively but could authorize a decompressive laminectomy at L2-3, but noted that if during surgery we had to perform a wider decompression to adequately decompress the neural elements and this caused what we felt was segmental instability above the old fusion, that they would allow us  to use our judgment and to make the decision intraoperatively to move forward with instrumented fusion to avoid iatrogenic instability and further surgery in the future.  Therefore I called the patient and explained this entire scenario to her and she demonstrated understanding well before admission to the hospital today.  This was also discussed with her once again today, that we would try to stay with simple decompression but in all likelihood we would need to move forward with instrumented fusion if we felt like there was instability at the level.  She once again demonstrated understanding of this.  Patient understood the risks, benefits, and alternatives and potential outcomes and wished to proceed.  PROCEDURE DETAILS:  The patient was brought to the operating room. After induction of generalized endotracheal anesthesia the patient was rolled into the prone position on chest rolls and all pressure points were padded. The patient's lumbar region was cleaned and then prepped with DuraPrep and draped in the usual sterile fashion. Anesthesia was injected and then a dorsal midline incision was made and carried down to the lumbosacral fascia. The fascia was opened and the paraspinous musculature was taken down in a subperiosteal fashion to expose L2-3 as well as the previously placed instrumentation.  We remove the locking caps from the previously placed screws as well as  the rods.  Each screw had good purchase.. A self-retaining retractor was placed. Intraoperative fluoroscopy confirmed my  level, and I started with placement of the L2 cortical pedicle screws. The pedicle screw entry zones were identified utilizing surface landmarks and  AP and lateral fluoroscopy. I scored the cortex with the high-speed drill and then used the hand drill to drill an upward and outward direction into the pedicle. I then tapped line to line. I then placed a 5.5 x 40 mm cortical pedicle screw into the pedicles of L2 bilaterally.    I then turned my attention to the decompression.  We started with simple hemilaminectomies at L2-3, but we felt like her facets were arthritic and that she had some segmental instability/ligamentous laxity even before the decompression started.  The joints appeared to be hypermobile.  Once we did the decompression, we felt like the level was definitely hypermobile and unstable and felt that the surgery would fail without graduation to instrumented fusion.  Therefore, the decision was made to move forward with fusion.   Therefore, complete lumbar laminectomies, hemi- facetectomies, and foraminotomies were performed at L2-3.  My nurse practitioner was directly involved in the decompression and exposure of the neural elements. the patient had significant spinal stenosis and this required more work than would be required for a simple exposure of the disc for posterior lumbar interbody fusion which would only require a limited laminotomy. Much more generous decompression and generous foraminotomy was undertaken in order to adequately decompress the neural elements and address the patient's leg pain. The yellow ligament was removed to expose the underlying dura and nerve roots, and generous foraminotomies were performed to adequately decompress the neural elements. Both the exiting and traversing nerve roots were decompressed on both sides until a coronary dilator passed easily along the nerve roots. Once the decompression was complete, I turned my attention to the posterior lower lumbar  interbody fusion. The epidural venous vasculature was coagulated and cut sharply. Disc space was incised and the initial discectomy was performed with pituitary rongeurs. The disc space was distracted with sequential distractors to a height of 8 mm. We then used a series of scrapers and shavers to prepare the endplates for fusion. The midline was prepared with Epstein curettes. Once the complete discectomy was finished, we packed an appropriate sized interbody cage with local autograft and morcellized allograft, gently retracted the nerve root, and tapped the cage into position at L2-3.  The midline between the cages was packed with morselized autograft and allograft.     We then decorticated the transverse processes and laid a mixture of morcellized autograft and allograft out over these to perform intertransverse arthrodesis at L2-3. We then placed lordotic rods into the multiaxial screw heads of the pedicle screws of L2 and L3 and locked these in position with the locking caps and anti-torque device. We then checked our construct with AP and lateral fluoroscopy. Irrigated with copious amounts of 0.5% povidone iodine solution followed by saline solution. Inspected the nerve roots once again to assure adequate decompression, lined to the dura with Gelfoam,  and then we closed the muscle and the fascia with 0 Vicryl. Closed the subcutaneous tissues with 2-0 Vicryl and subcuticular tissues with 3-0 Vicryl. The skin was closed with benzoin and Steri-Strips. Dressing was then applied, the patient was awakened from general anesthesia and transported to the recovery room in stable condition. At the end of the procedure all sponge, needle and instrument counts were correct.   PLAN  OF CARE: admit to inpatient  PATIENT DISPOSITION:  PACU - hemodynamically stable.   Delay start of Pharmacological VTE agent (>24hrs) due to surgical blood loss or risk of bleeding:  yes

## 2024-01-07 NOTE — Discharge Instructions (Signed)
   Wound Care Keep incision covered and dry until post op day 3. You may remove the Honeycomb dressing on post op day 3. Leave steri-strips on back.  They will fall off by themselves. Do not put any creams, lotions, or ointments on incision. You are fine to shower. Let water run over incision and pat dry.  Activity Walk each and every day, increasing distance each day. No lifting greater than 8 lbs.  Avoid excessive back motion. No driving, or riding a car unless coming back and forth to see the doctor  Diet Resume your normal diet.   Return to Work Will be discussed at your follow up appointment.  Call Your Doctor If Any of These Occur Redness, drainage, or swelling at the wound.  Temperature greater than 101 degrees. Severe pain not relieved by pain medication. Incision starts to come apart.  Follow Up Appt Call [phone] if you have one or any problem.

## 2024-01-07 NOTE — Transfer of Care (Signed)
 Immediate Anesthesia Transfer of Care Note  Patient: Breanna Palmer  Procedure(s) Performed: ATTEMPTED DECOMPRESSIVE LAMINECTOMY FORAMINOTOMY POSTERIOR LUMBAR INTERBODY FUSION LUMBAR TWO-LUMBAR THREE (Bilateral)  Patient Location: PACU  Anesthesia Type:General  Level of Consciousness: awake and drowsy  Airway & Oxygen Therapy: Patient Spontanous Breathing  Post-op Assessment: Report given to RN and Post -op Vital signs reviewed and stable  Post vital signs: Reviewed and stable  Last Vitals:  Vitals Value Taken Time  BP    Temp    Pulse 93 01/07/24 13:32  Resp 16 01/07/24 13:32  SpO2 90 % 01/07/24 13:32  Vitals shown include unfiled device data.  Last Pain:  Vitals:   01/07/24 0856  TempSrc:   PainSc: 6       Patients Stated Pain Goal: 3 (01/07/24 0854)  Complications: No notable events documented.

## 2024-01-07 NOTE — Anesthesia Procedure Notes (Signed)
 Procedure Name: Intubation Date/Time: 01/07/2024 11:00 AM  Performed by: Genny Gun, CRNAPre-anesthesia Checklist: Patient identified, Emergency Drugs available, Suction available, Patient being monitored and Timeout performed Patient Re-evaluated:Patient Re-evaluated prior to induction Oxygen Delivery Method: Circle system utilized Preoxygenation: Pre-oxygenation with 100% oxygen Induction Type: IV induction and Cricoid Pressure applied Ventilation: Mask ventilation without difficulty Laryngoscope Size: Mac and 4 Grade View: Grade I Tube type: Oral Tube size: 7.0 mm Number of attempts: 1 Placement Confirmation: ETT inserted through vocal cords under direct vision, positive ETCO2 and breath sounds checked- equal and bilateral Secured at: 22 cm Tube secured with: Tape Dental Injury: Teeth and Oropharynx as per pre-operative assessment

## 2024-01-07 NOTE — H&P (Signed)
 Subjective: Patient is a 63 y.o. female admitted for adjacent level stenosis. Onset of symptoms was several months ago, gradually worsening since that time.  The pain is rated severe, and is located at the waist or across the lower back and radiates to legs. The pain is described as aching and occurs all day. The symptoms have been progressive. Symptoms are exacerbated by exercise, standing, and walking for more than several minutes. MRI or CT showed adjacent level stenosis L2-3   Past Medical History:  Diagnosis Date   Anxiety    Arthritis    Depression    Dysrhythmia    PVCs and LBBB   Dysuria 09/15/2016   Esophageal ulcer    Fibromyalgia    Gastritis    GERD (gastroesophageal reflux disease)    Hematuria, gross 09/15/2016   History of kidney stones    Hyperlipidemia    Menopause    AGE 72   Missed ab    NSVD (normal spontaneous vaginal delivery)    X2   Perforated peptic ulcer (HCC)    Pneumonia    Pre-diabetes    Seasonal allergies    Sleep apnea    possible sleep apnea, was using a CPAP at one time, but not now    Past Surgical History:  Procedure Laterality Date   COLONOSCOPY     DILATION AND CURETTAGE OF UTERUS     EXPLORATORY LAPAROTOMY  10/08/2019   with abdominal washout and EGD   FOOT SURGERY  2011   right   FOOT SURGERY Right    spurs   KNEE ARTHROSCOPY  1998, 2008   right   MAXIMUM ACCESS (MAS) TRANSFORAMINAL LUMBAR INTERBODY FUSION (TLIF) 2 LEVEL N/A 05/01/2019   Procedure: Transforaminal Lumbar Interbody Fusion - Lumbar Three-Lumbar Four - Lumbar Four-Lumbar Five;  Surgeon: Joshua Alm RAMAN, MD;  Location: [facility] OR;  Service: Neurosurgery;  Laterality: N/A;  Transforaminal Lumbar Interbody Fusion - Lumbar Three-Lumbar Four - Lumbar Four-Lumbar Five   VAGINAL HYSTERECTOMY  1997    Prior to Admission medications   Medication Sig Start Date End Date Taking? Authorizing Provider  acebutolol  (SECTRAL ) 200 MG capsule TAKE 1 CAPSULE BY MOUTH TWICE A DAY 12/24/20  Yes  Ladona Heinz, MD  Calcium Carbonate (CALCIUM 500 PO) Take 500 mg by mouth daily.   Yes [provider]  Cholecalciferol (VITAMIN D3) 50 MCG (2000 UT) TABS Take 2,000 Units by mouth every evening.   Yes [provider]  clonazePAM  (KLONOPIN ) 1 MG tablet Take 0.5-1 mg by mouth 2 (two) times daily as needed for anxiety.    Yes [provider]  cromolyn (OPTICROM) 4 % ophthalmic solution Place 1 drop into both eyes 3 (three) times daily.   Yes [provider]  cyanocobalamin (VITAMIN B12) 1000 MCG tablet Take 1,000 mcg by mouth daily.   Yes [provider]  diphenhydrAMINE  (BENADRYL ) 25 mg capsule Take 25 mg by mouth every 6 (six) hours as needed for allergies.   Yes [provider]  DULoxetine  (CYMBALTA ) 60 MG capsule Take 60 mg by mouth daily.   Yes [provider]  Magnesium 250 MG TABS Take 250 mg by mouth daily.   Yes [provider]  Melatonin 10 MG SUBL Place 10 mg under the tongue at bedtime.   Yes [provider]  pantoprazole  (PROTONIX ) 40 MG tablet TAKE 1 TABLET BY MOUTH EVERY DAY 11/27/20  Yes Pyrtle, Heinz HERO, MD  Perfluorohexyloctane (MIEBO) 1.338 GM/ML SOLN Place 1 drop into both  eyes 2 (two) times daily.   Yes [provider]  rosuvastatin (CRESTOR) 40 MG tablet Take 40 mg by mouth at bedtime.    Yes [provider]  sucralfate  (CARAFATE ) 1 g tablet Take 1 tablet (1 g total) by mouth 4 (four) times daily -  with meals and at bedtime. Patient taking differently: Take 1 g by mouth daily. 02/21/20  Yes Pyrtle, Gordy HERO, MD  valACYclovir (VALTREX) 500 MG tablet Take 500 mg by mouth daily as needed (breakouts).    [provider]   Allergies  Allergen Reactions   Morphine And Codeine Itching   Sulfa Antibiotics Rash    Mild; has taken since and been ok with it    Social History   Tobacco Use   Smoking status: Former    Current packs/day: 0.00    Types: Cigarettes    Quit date:  10/19/1997    Years since quitting: 26.2   Smokeless tobacco: Never  Substance Use Topics   Alcohol use: Yes    Alcohol/week: 2.0 standard drinks of alcohol    Types: 2 Cans of beer per week    Comment: weekends    Family History  Problem Relation Age of Onset   Colon cancer Paternal Grandmother 21   Diabetes Mother    Diabetes Sister    Cancer Father        prostate   Breast cancer Paternal Aunt 22   Esophageal cancer Neg Hx    Stomach cancer Neg Hx    Rectal cancer Neg Hx      Review of Systems  Positive ROS: neg  All other systems have been reviewed and were otherwise negative with the exception of those mentioned in the HPI and as above.  Objective: Vital signs in last 24 hours: Temp:  [98.6 F (37 C)] 98.6 F (37 C) (10/24 0826) Pulse Rate:  [79] 79 (10/24 0826) Resp:  [18] 18 (10/24 0826) BP: (129)/(77) 129/77 (10/24 0826) SpO2:  [98 %] 98 % (10/24 0826) Weight:  [64 kg] 64 kg (10/24 0826)  General Appearance: Alert, cooperative, no distress, appears stated age Head: Normocephalic, without obvious abnormality, atraumatic Eyes: PERRL, conjunctiva/corneas clear, EOM's intact    Neck: Supple, symmetrical, trachea midline Back: Symmetric, no curvature, ROM normal, no CVA tenderness Lungs:  respirations unlabored Heart: Regular rate and rhythm Abdomen: Soft, non-tender Extremities: Extremities normal, atraumatic, no cyanosis or edema Pulses: 2+ and symmetric all extremities Skin: Skin color, texture, turgor normal, no rashes or lesions  NEUROLOGIC:   Mental status: Alert and oriented x4,  no aphasia, good attention span, fund of knowledge, and memory Motor Exam - grossly normal Sensory Exam - grossly normal Reflexes: 1= Coordination - grossly normal Gait - grossly normal Balance - grossly normal Cranial Nerves: I: smell Not tested  II: visual acuity  OS: nl    OD: nl  II: visual fields Full to confrontation  II: pupils Equal, round, reactive to light   III,VII: ptosis None  III,IV,VI: extraocular muscles  Full ROM  V: mastication Normal  V: facial light touch sensation  Normal  V,VII: corneal reflex  Present  VII: facial muscle function - upper  Normal  VII: facial muscle function - lower Normal  VIII: hearing Not tested  IX: soft palate elevation  Normal  IX,X: gag reflex Present  XI: trapezius strength  5/5  XI: sternocleidomastoid strength 5/5  XI: neck flexion strength  5/5  XII: tongue strength  Normal    Data  Review Lab Results  Component Value Date   WBC 6.7 12/22/2023   HGB 13.8 12/22/2023   HCT 41.9 12/22/2023   MCV 92.5 12/22/2023   PLT 340 12/22/2023   Lab Results  Component Value Date   NA 139 12/22/2023   K 4.3 12/22/2023   CL 100 12/22/2023   CO2 28 12/22/2023   BUN 10 12/22/2023   CREATININE 0.67 12/22/2023   GLUCOSE 124 (H) 12/22/2023   Lab Results  Component Value Date   INR 0.9 12/22/2023    Assessment/Plan:  Estimated body mass index is 22.08 kg/m as calculated from the following:   Height as of this encounter: 5' 7 (1.702 m).   Weight as of this encounter: 64 kg. Patient admitted for PLIF L2-3. Patient has failed a reasonable attempt at conservative therapy.  I explained the condition and procedure to the patient and answered any questions.  Patient wishes to proceed with procedure as planned. Understands risks/ benefits and typical outcomes of procedure.   Alm GORMAN Molt 01/07/2024 10:40 AM

## 2024-01-08 DIAGNOSIS — M48061 Spinal stenosis, lumbar region without neurogenic claudication: Secondary | ICD-10-CM | POA: Diagnosis not present

## 2024-01-08 MED ORDER — OXYCODONE HCL 10 MG PO TABS: 10.0000 mg | ORAL_TABLET | ORAL | 0 refills | Status: AC | PRN

## 2024-01-08 MED ORDER — METHOCARBAMOL 500 MG PO TABS: 500.0000 mg | ORAL_TABLET | Freq: Four times a day (QID) | ORAL | 0 refills | Status: AC | PRN

## 2024-01-08 NOTE — Evaluation (Signed)
 Occupational Therapy Evaluation/Discharge Patient Details Name: Breanna Palmer MRN: [id number] DOB: [birth year] Today's Date: 01/08/2024   History of Present Illness   Pt is a 63 y/o female admitted for lumbar laminectomy and fusion at L2-3, as well as removal of segmental fixation at L3-5 on 10/24 in setting of spinal stenosis. PMH: anxiety, fibromyalgia, GERD, HLD, pre diabetes     Clinical Impressions PTA, pt lives with spouse and typically completely Independent with ADLs, IADLs, driving and mobility though limited by back pain. Pt presents now with pain improvements. Educated in back precautions for ADLs, IADL consideration, LSO brace wear/mgmt and general body mechanics. Pt able to demonstrate ADLs/hallway mobility with Modified Independence and without safety concerns. Husband at bedside, able to assist as needed upon DC. Encouraged continued mobility at home. No further skilled OT services needed at this time.     If plan is discharge home, recommend the following:   Assist for transportation     Functional Status Assessment   Patient has had a recent decline in their functional status and demonstrates the ability to make significant improvements in function in a reasonable and predictable amount of time.     Equipment Recommendations   None recommended by OT     Recommendations for Other Services         Precautions/Restrictions   Precautions Precautions: Back Precaution Booklet Issued: Yes (comment) Recall of Precautions/Restrictions: Intact Required Braces or Orthoses: Spinal Brace Spinal Brace: Lumbar corset Restrictions Weight Bearing Restrictions Per Provider Order: No     Mobility Bed Mobility Overal bed mobility: Modified Independent                  Transfers Overall transfer level: Independent Equipment used: None                      Balance Overall balance assessment: No apparent balance deficits (not formally  assessed)                                         ADL either performed or assessed with clinical judgement   ADL Overall ADL's : Modified independent                                       General ADL Comments: Educated re back precautions for ADLs w/ pt able to dress self without issues, able to don LSO brace and mobilize in hallway. Pt did hold to hallway rail for security due to trying to figure out locations of pain and did endorse some dizziness from pain meds though otherwise did well and no safety concerns. Discussed assist for IADLs, typical driving restrictions and when to wear brace. Husband at bedside,supportive and able to assist as needed     Vision Baseline Vision/History: 1 Wears glasses Ability to See in Adequate Light: 0 Adequate Patient Visual Report: No change from baseline Vision Assessment?: No apparent visual deficits     Perception         Praxis         Pertinent Vitals/Pain Pain Assessment Pain Assessment: Faces Faces Pain Scale: Hurts a little bit Pain Location: incisional pain Pain Descriptors / Indicators: Guarding, Grimacing Pain Intervention(s): Monitored during session, Limited activity within patient's tolerance     Extremity/Trunk Assessment Upper Extremity Assessment  Upper Extremity Assessment: Overall WFL for tasks assessed   Lower Extremity Assessment Lower Extremity Assessment: Overall WFL for tasks assessed   Cervical / Trunk Assessment Cervical / Trunk Assessment: Back Surgery   Communication Communication Communication: No apparent difficulties   Cognition Arousal: Alert Behavior During Therapy: WFL for tasks assessed/performed Cognition: No apparent impairments                               Following commands: Intact       Cueing  General Comments   Cueing Techniques: Verbal cues      Exercises     Shoulder Instructions      Home Living Family/patient expects to  be discharged to:: Private residence Living Arrangements: Spouse/significant other Available Help at Discharge: Family;Available 24 hours/day Type of Home: House Home Access: Stairs to enter [employer] of Steps: 4 Entrance Stairs-Rails: None Home Layout: One level     Bathroom Shower/Tub: Chief Strategy Officer: Standard     Home Equipment: None   Additional Comments: reports she can borrow a shower chair if needed      Prior Functioning/Environment Prior Level of Function : Independent/Modified Independent;Driving             Mobility Comments: no AD but pain limited ADLs Comments: Indep with ADLs, IADLs    OT Problem List:     OT Treatment/Interventions:        OT Goals(Current goals can be found in the care plan section)   Acute Rehab OT Goals Patient Stated Goal: good recovery from this surgery OT Goal Formulation: All assessment and education complete, DC therapy   OT Frequency:       Co-evaluation              AM-PAC OT 6 Clicks Daily Activity     Outcome Measure Help from another person eating meals?: None Help from another person taking care of personal grooming?: None Help from another person toileting, which includes using toliet, bedpan, or urinal?: None Help from another person bathing (including washing, rinsing, drying)?: None Help from another person to put on and taking off regular upper body clothing?: None Help from another person to put on and taking off regular lower body clothing?: None 6 Click Score: 24   End of Session Equipment Utilized During Treatment: Back brace Nurse Communication: Mobility status  Activity Tolerance: Patient tolerated treatment well Patient left: in bed;with call bell/phone within reach;with family/visitor present  OT Visit Diagnosis: Pain Pain - part of body:  (back)                Time: 9188-9164 OT Time Calculation (min): 24 min Charges:  OT General Charges $OT Visit:  1 Visit OT Evaluation $OT Eval Low Complexity: 1 Low  Mliss NOVAK, OTR/L Acute Rehab Services Office: [phone]   Mliss Fish 01/08/2024, 9:14 AM

## 2024-01-08 NOTE — Plan of Care (Signed)

## 2024-01-08 NOTE — Progress Notes (Signed)
 Patient alert and oriented, ambulate, voided. Surgical dressing saturated, dressing changed per MD order. Extra supplies given to the patient. D/c instructions explain and given all questions answered to the patient and family satisfaction.

## 2024-01-08 NOTE — Progress Notes (Signed)
 PT Cancellation Note and Discharge  Patient Details Name: Breanna Palmer MRN: [id number] DOB: Nov 06, 1960   Cancelled Treatment:    Reason Eval/Treat Not Completed: PT screened, no needs identified, will sign off. Discussed pt case with OT who reports pt is currently mobilizing at a modified independent level and does not require a formal PT evaluation at this time. PT signing off. If needs change, please reconsult.     Leita JONETTA Sable 01/08/2024, 8:38 AM  Leita Sable, PT, DPT Acute Rehabilitation Services Secure Chat Preferred Office: [phone]

## 2024-01-08 NOTE — Discharge Summary (Signed)
 Physician Discharge Summary  Patient ID: Breanna Palmer MRN: [id number] DOB/AGE: 06/06/1960 63 y.o.  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 was admitted and taken to the operating room for an uncomplicated extension of her fusion. Post op she is ambulating, voiding, and tolerating a regular diet. Her wound is clean, dry, without signs of infection.  Treatments: surgery:  1. Decompressive lumbar laminectomy, hemi facetectomy and foraminotomies L2-3 requiring more work than would be required for a simple exposure of the disk for PLIF in order to adequately decompress the neural elements and address the spinal stenosis 2. Posterior lumbar interbody fusion L2-3 using PTI interbody cages packed with morcellized allograft and autograft  3. Posterior fixation L2-3 using ATEC cortical pedicle screws.  4. Intertransverse arthrodesis L2-3 using morcellized autograft and allograft. 5.  Removal of segmental fixation L3-L5 Discharge Exam: Blood pressure (!) 105/56, pulse 95, temperature (!) 97.5 F (36.4 C), temperature source Oral, resp. rate 16, height 5' 7 (1.702 m), weight 64 kg, SpO2 92%. General appearance: alert, cooperative, appears stated age, and mild distress  Disposition: Discharge disposition: 01-Home or Self Care      RADICULOPATHY LUMBAR REGION  Allergies as of 01/08/2024       Reactions   Morphine And Codeine Itching   Sulfa Antibiotics Rash   Mild; has taken since and been ok with it        Medication List     TAKE these medications    acebutolol  200 MG  capsule Commonly known as: SECTRAL  TAKE 1 CAPSULE BY MOUTH TWICE A DAY   CALCIUM 500 PO Take 500 mg by mouth daily.   clonazePAM  1 MG tablet Commonly known as: KLONOPIN  Take 0.5-1 mg by mouth 2 (two) times daily as needed for anxiety.   cromolyn 4 % ophthalmic solution Commonly known as: OPTICROM Place 1 drop into both eyes 3 (three) times daily.   cyanocobalamin 1000 MCG tablet Commonly known as: VITAMIN B12 Take 1,000 mcg by mouth daily.   diphenhydrAMINE  25 mg capsule Commonly known as: BENADRYL  Take 25 mg by mouth every 6 (six) hours as needed for allergies.   DULoxetine  60 MG capsule Commonly known as: CYMBALTA  Take 60 mg by mouth daily.   Magnesium 250 MG Tabs Take 250 mg by mouth daily.   Melatonin 10 MG Subl Place 10 mg under the tongue at bedtime.   methocarbamol  500 MG tablet Commonly known as: ROBAXIN  Take 1 tablet (500 mg total) by mouth every 6 (six) hours as needed for muscle spasms.   Miebo 1.338 GM/ML Soln Generic drug: Perfluorohexyloctane Place 1 drop into both eyes 2 (two) times daily.   Oxycodone  HCl 10 MG Tabs Take 1 tablet (10 mg total) by mouth every 3 (three) hours as needed for moderate pain (pain score 4-6).   pantoprazole  40 MG tablet Commonly known as: PROTONIX  TAKE 1 TABLET BY MOUTH EVERY DAY   rosuvastatin 40 MG tablet Commonly known as: CRESTOR Take 40 mg by mouth at bedtime.   sucralfate  1 g tablet Commonly known as: Carafate  Take 1 tablet (1 g total) by mouth 4 (four) times daily -  with meals and at bedtime. What changed: when  to take this   valACYclovir 500 MG tablet Commonly known as: VALTREX Take 500 mg by mouth daily as needed (breakouts).   Vitamin D3 50 MCG (2000 UT) Tabs Take 2,000 Units by mouth every evening.               Durable Medical Equipment  (From admission, onward)           Start     Ordered   01/07/24 1516  DME Walker rolling  Once       Question:  Patient needs a walker to treat  with the following condition  Answer:  S/P lumbar fusion   01/07/24 1515   01/07/24 1516  DME 3 n 1  Once        01/07/24 1515            Follow-up Information     Joshua Alm Hamilton, MD. Call.   Specialty: Neurosurgery Why: If symptoms worsen Contact information: [address] [place] [address] [phone]                 Signed: Rockey Peru 01/08/2024, 11:20 AM

## 2024-01-08 NOTE — Anesthesia Postprocedure Evaluation (Signed)
 Anesthesia Post Note  Patient: Breanna Palmer  Procedure(s) Performed: ATTEMPTED DECOMPRESSIVE LAMINECTOMY FORAMINOTOMY POSTERIOR LUMBAR INTERBODY FUSION LUMBAR TWO-LUMBAR THREE (Bilateral)     Patient location during evaluation: PACU Anesthesia Type: General Level of consciousness: awake and alert Pain management: pain level controlled Vital Signs Assessment: post-procedure vital signs reviewed and stable Respiratory status: spontaneous breathing, nonlabored ventilation, respiratory function stable and patient connected to nasal cannula oxygen Cardiovascular status: blood pressure returned to baseline and stable Postop Assessment: no apparent nausea or vomiting Anesthetic complications: no   No notable events documented.  Last Vitals:  Vitals:   01/08/24 0030 01/08/24 0402  BP: 110/75 (!) 105/56  Pulse: 93 95  Resp: 16 16  Temp: 36.5 C (!) 36.4 C  SpO2: 95% 92%    Last Pain:  Vitals:   01/08/24 0644  TempSrc:   PainSc: 7                  Thom JONELLE Peoples

## 2024-01-10 ENCOUNTER — Encounter (HOSPITAL_COMMUNITY): Payer: Self-pay | Admitting: Neurological Surgery
